# Patient Record
Sex: Male | Born: 1950 | Race: Black or African American | Hispanic: No | Smoking: Former smoker
Health system: Southern US, Community
[De-identification: ages and names within clinical notes are randomized; demographics above are authoritative.]

## PROBLEM LIST (undated history)

## (undated) DIAGNOSIS — C61 Malignant neoplasm of prostate: Secondary | ICD-10-CM

## (undated) DIAGNOSIS — I2510 Atherosclerotic heart disease of native coronary artery without angina pectoris: Secondary | ICD-10-CM

## (undated) DIAGNOSIS — I10 Essential (primary) hypertension: Secondary | ICD-10-CM

## (undated) DIAGNOSIS — E119 Type 2 diabetes mellitus without complications: Secondary | ICD-10-CM

## (undated) DIAGNOSIS — M1990 Unspecified osteoarthritis, unspecified site: Secondary | ICD-10-CM

## (undated) DIAGNOSIS — E785 Hyperlipidemia, unspecified: Secondary | ICD-10-CM

## (undated) DIAGNOSIS — I714 Abdominal aortic aneurysm, without rupture, unspecified: Secondary | ICD-10-CM

## (undated) HISTORY — PX: CARDIAC CATHETERIZATION: SHX172

## (undated) HISTORY — DX: Atherosclerotic heart disease of native coronary artery without angina pectoris: I25.10

## (undated) HISTORY — PX: PROSTATE BIOPSY: SHX241

---

## 2011-07-21 ENCOUNTER — Other Ambulatory Visit (HOSPITAL_COMMUNITY): Payer: Self-pay | Admitting: Family Medicine

## 2011-07-21 DIAGNOSIS — R200 Anesthesia of skin: Secondary | ICD-10-CM

## 2011-07-21 DIAGNOSIS — M79601 Pain in right arm: Secondary | ICD-10-CM

## 2011-07-27 DIAGNOSIS — I252 Old myocardial infarction: Secondary | ICD-10-CM

## 2011-07-27 HISTORY — DX: Old myocardial infarction: I25.2

## 2011-07-28 ENCOUNTER — Other Ambulatory Visit (HOSPITAL_COMMUNITY): Payer: Self-pay

## 2011-07-29 ENCOUNTER — Other Ambulatory Visit (HOSPITAL_COMMUNITY): Payer: Self-pay

## 2011-08-16 ENCOUNTER — Inpatient Hospital Stay (HOSPITAL_COMMUNITY)
Admission: EM | Admit: 2011-08-16 | Discharge: 2011-08-20 | DRG: 247 | Disposition: A | Discharge status: Home / Self Care | Payer: PRIVATE HEALTH INSURANCE | Attending: Cardiology | Admitting: Cardiology

## 2011-08-16 ENCOUNTER — Encounter (HOSPITAL_COMMUNITY)
Admission: EM | Disposition: A | Discharge status: Home / Self Care | Payer: Self-pay | Source: Home / Self Care | Attending: Cardiology

## 2011-08-16 ENCOUNTER — Ambulatory Visit (HOSPITAL_COMMUNITY): Admit: 2011-08-16 | Payer: Self-pay | Admitting: Cardiology

## 2011-08-16 ENCOUNTER — Emergency Department (HOSPITAL_COMMUNITY): Payer: PRIVATE HEALTH INSURANCE

## 2011-08-16 ENCOUNTER — Encounter (HOSPITAL_COMMUNITY): Payer: Self-pay | Admitting: *Deleted

## 2011-08-16 DIAGNOSIS — I472 Ventricular tachycardia, unspecified: Secondary | ICD-10-CM | POA: Diagnosis present

## 2011-08-16 DIAGNOSIS — I4729 Other ventricular tachycardia: Secondary | ICD-10-CM | POA: Diagnosis present

## 2011-08-16 DIAGNOSIS — Z794 Long term (current) use of insulin: Secondary | ICD-10-CM

## 2011-08-16 DIAGNOSIS — Z7982 Long term (current) use of aspirin: Secondary | ICD-10-CM

## 2011-08-16 DIAGNOSIS — Z955 Presence of coronary angioplasty implant and graft: Secondary | ICD-10-CM

## 2011-08-16 DIAGNOSIS — I213 ST elevation (STEMI) myocardial infarction of unspecified site: Secondary | ICD-10-CM

## 2011-08-16 DIAGNOSIS — E119 Type 2 diabetes mellitus without complications: Secondary | ICD-10-CM | POA: Diagnosis present

## 2011-08-16 DIAGNOSIS — E785 Hyperlipidemia, unspecified: Secondary | ICD-10-CM | POA: Diagnosis present

## 2011-08-16 DIAGNOSIS — I219 Acute myocardial infarction, unspecified: Secondary | ICD-10-CM

## 2011-08-16 DIAGNOSIS — Z87891 Personal history of nicotine dependence: Secondary | ICD-10-CM

## 2011-08-16 DIAGNOSIS — I2510 Atherosclerotic heart disease of native coronary artery without angina pectoris: Secondary | ICD-10-CM

## 2011-08-16 DIAGNOSIS — I2582 Chronic total occlusion of coronary artery: Secondary | ICD-10-CM | POA: Diagnosis present

## 2011-08-16 DIAGNOSIS — I2119 ST elevation (STEMI) myocardial infarction involving other coronary artery of inferior wall: Principal | ICD-10-CM | POA: Diagnosis present

## 2011-08-16 DIAGNOSIS — R7309 Other abnormal glucose: Secondary | ICD-10-CM | POA: Diagnosis present

## 2011-08-16 DIAGNOSIS — I10 Essential (primary) hypertension: Secondary | ICD-10-CM | POA: Diagnosis present

## 2011-08-16 DIAGNOSIS — Z79899 Other long term (current) drug therapy: Secondary | ICD-10-CM

## 2011-08-16 HISTORY — PX: LEFT HEART CATHETERIZATION WITH CORONARY ANGIOGRAM: SHX5451

## 2011-08-16 HISTORY — DX: Essential (primary) hypertension: I10

## 2011-08-16 HISTORY — DX: Presence of coronary angioplasty implant and graft: Z95.5

## 2011-08-16 HISTORY — DX: Hyperlipidemia, unspecified: E78.5

## 2011-08-16 LAB — COMPREHENSIVE METABOLIC PANEL
ALT: 24 U/L (ref 0–53)
Alkaline Phosphatase: 63 U/L (ref 39–117)
Alkaline Phosphatase: 60 U/L (ref 39–117)
BUN: 16 mg/dL (ref 6–23)
BUN: 16 mg/dL (ref 6–23)
CO2: 21 mEq/L (ref 19–32)
Chloride: 100 mEq/L (ref 96–112)
Chloride: 101 mEq/L (ref 96–112)
Creatinine, Ser: 1.34 mg/dL (ref 0.50–1.35)
GFR calc Af Amer: 64 mL/min — ABNORMAL LOW (ref >90)
GFR calc Af Amer: 78 mL/min — ABNORMAL LOW (ref >90)
GFR calc non Af Amer: 68 mL/min — ABNORMAL LOW (ref >90)
Glucose, Bld: 241 mg/dL — ABNORMAL HIGH (ref 70–99)
Glucose, Bld: 191 mg/dL — ABNORMAL HIGH (ref 70–99)
Potassium: 4.1 mEq/L (ref 3.5–5.1)
Potassium: 4.2 mEq/L (ref 3.5–5.1)
Sodium: 134 mEq/L — ABNORMAL LOW (ref 135–145)
Total Bilirubin: 0.3 mg/dL (ref 0.3–1.2)
Total Bilirubin: 0.3 mg/dL (ref 0.3–1.2)
Total Protein: 7.8 g/dL (ref 6.0–8.3)

## 2011-08-16 LAB — PROTIME-INR
INR: 1.06 (ref 0.00–1.49)
Prothrombin Time: 14 seconds (ref 11.6–15.2)

## 2011-08-16 LAB — CBC
HCT: 41.7 % (ref 39.0–52.0)
HCT: 39.8 % (ref 39.0–52.0)
Hemoglobin: 14 g/dL (ref 13.0–17.0)
Hemoglobin: 13.4 g/dL (ref 13.0–17.0)
MCHC: 33.6 g/dL (ref 30.0–36.0)
MCHC: 33.7 g/dL (ref 30.0–36.0)
MCV: 88.7 fL (ref 78.0–100.0)
RBC: 4.56 MIL/uL (ref 4.22–5.81)
RDW: 12.9 % (ref 11.5–15.5)

## 2011-08-16 LAB — DIFFERENTIAL
Lymphs Abs: 1.7 10*3/uL (ref 0.7–4.0)
Monocytes Absolute: 0.4 10*3/uL (ref 0.1–1.0)
Monocytes Relative: 4 % (ref 3–12)
Neutro Abs: 6.5 10*3/uL (ref 1.7–7.7)
Neutrophils Relative %: 75 % (ref 43–77)

## 2011-08-16 LAB — CARDIAC PANEL(CRET KIN+CKTOT+MB+TROPI)
Relative Index: 7.4 — ABNORMAL HIGH (ref 0.0–2.5)
Total CK: 1121 U/L — ABNORMAL HIGH (ref 7–232)
Troponin I: 15.78 ng/mL (ref <0.30)

## 2011-08-16 LAB — MRSA PCR SCREENING: MRSA by PCR: NEGATIVE

## 2011-08-16 LAB — MAGNESIUM: Magnesium: 2.1 mg/dL (ref 1.5–2.5)

## 2011-08-16 LAB — APTT: aPTT: 68 seconds — ABNORMAL HIGH (ref 24–37)

## 2011-08-16 SURGERY — LEFT HEART CATHETERIZATION WITH CORONARY ANGIOGRAM
Anesthesia: LOCAL

## 2011-08-16 MED ORDER — HEPARIN (PORCINE) IN NACL 100-0.45 UNIT/ML-% IJ SOLN
INTRAMUSCULAR | Status: AC
  Filled 2011-08-16: qty 250

## 2011-08-16 MED ORDER — HEPARIN (PORCINE) IN NACL 100-0.45 UNIT/ML-% IJ SOLN
1000.0000 [IU]/h | INTRAMUSCULAR | Status: DC
  Administered 2011-08-16: 1000 [IU]/h via INTRAVENOUS
  Filled 2011-08-16: qty 250

## 2011-08-16 MED ORDER — ASPIRIN EC 81 MG PO TBEC
81.0000 mg | DELAYED_RELEASE_TABLET | Freq: Every day | ORAL | Status: DC
  Administered 2011-08-17 – 2011-08-20 (×4): 81 mg via ORAL
  Filled 2011-08-16 (×4): qty 1

## 2011-08-16 MED ORDER — SODIUM CHLORIDE 0.9 % IJ SOLN
3.0000 mL | Freq: Two times a day (BID) | INTRAMUSCULAR | Status: DC
  Administered 2011-08-16 – 2011-08-19 (×6): 3 mL via INTRAVENOUS

## 2011-08-16 MED ORDER — SODIUM CHLORIDE 0.9 % IJ SOLN: 3.0000 mL | INTRAMUSCULAR | Status: DC | PRN

## 2011-08-16 MED ORDER — ATORVASTATIN CALCIUM 80 MG PO TABS
80.0000 mg | ORAL_TABLET | Freq: Every day | ORAL | Status: DC
  Administered 2011-08-17 – 2011-08-19 (×3): 80 mg via ORAL
  Filled 2011-08-16 (×4): qty 1

## 2011-08-16 MED ORDER — PRASUGREL HCL 10 MG PO TABS
ORAL_TABLET | ORAL | Status: AC
  Filled 2011-08-16: qty 6

## 2011-08-16 MED ORDER — SODIUM CHLORIDE 0.9 % IV SOLN: INTRAVENOUS | Status: DC

## 2011-08-16 MED ORDER — NITROGLYCERIN IN D5W 200-5 MCG/ML-% IV SOLN: 5.0000 ug/min | Freq: Once | INTRAVENOUS | Status: DC

## 2011-08-16 MED ORDER — ASPIRIN 81 MG PO CHEW: 81.0000 mg | CHEWABLE_TABLET | Freq: Every day | ORAL | Status: DC

## 2011-08-16 MED ORDER — BIVALIRUDIN 250 MG IV SOLR
INTRAVENOUS | Status: AC
  Filled 2011-08-16: qty 250

## 2011-08-16 MED ORDER — HEPARIN (PORCINE) IN NACL 2-0.9 UNIT/ML-% IJ SOLN
INTRAMUSCULAR | Status: AC
  Filled 2011-08-16: qty 2000

## 2011-08-16 MED ORDER — LIDOCAINE HCL (PF) 1 % IJ SOLN
INTRAMUSCULAR | Status: AC
  Filled 2011-08-16: qty 30

## 2011-08-16 MED ORDER — ONDANSETRON HCL 4 MG/2ML IJ SOLN: 4.0000 mg | Freq: Four times a day (QID) | INTRAMUSCULAR | Status: DC | PRN

## 2011-08-16 MED ORDER — ACTIVE PARTNERSHIP FOR HEALTH OF YOUR HEART BOOK
Freq: Once | Status: AC
  Administered 2011-08-17: 01:00:00
  Filled 2011-08-16: qty 1

## 2011-08-16 MED ORDER — SODIUM CHLORIDE 0.9 % IV SOLN
250.0000 mL | INTRAVENOUS | Status: DC | PRN
  Administered 2011-08-17: 1000 mL via INTRAVENOUS

## 2011-08-16 MED ORDER — PRASUGREL HCL 10 MG PO TABS
10.0000 mg | ORAL_TABLET | Freq: Every day | ORAL | Status: DC
  Administered 2011-08-17 – 2011-08-20 (×4): 10 mg via ORAL
  Filled 2011-08-16 (×4): qty 1

## 2011-08-16 MED ORDER — ACETAMINOPHEN 325 MG PO TABS
650.0000 mg | ORAL_TABLET | ORAL | Status: DC | PRN
  Administered 2011-08-18: 650 mg via ORAL
  Filled 2011-08-16: qty 2

## 2011-08-16 MED ORDER — MIDAZOLAM HCL 2 MG/2ML IJ SOLN
INTRAMUSCULAR | Status: AC
  Filled 2011-08-16: qty 2

## 2011-08-16 MED ORDER — SODIUM CHLORIDE 0.9 % IV SOLN: 1.0000 mL/kg/h | INTRAVENOUS | Status: AC

## 2011-08-16 MED ORDER — METOPROLOL TARTRATE 25 MG PO TABS
25.0000 mg | ORAL_TABLET | Freq: Two times a day (BID) | ORAL | Status: DC
  Administered 2011-08-16 – 2011-08-17 (×3): 25 mg via ORAL
  Filled 2011-08-16 (×5): qty 1

## 2011-08-16 MED ORDER — NITROGLYCERIN 0.2 MG/ML ON CALL CATH LAB
INTRAVENOUS | Status: AC
  Filled 2011-08-16: qty 1

## 2011-08-16 MED ORDER — ASPIRIN 81 MG PO CHEW
324.0000 mg | CHEWABLE_TABLET | Freq: Once | ORAL | Status: AC
  Administered 2011-08-16: 324 mg via ORAL
  Filled 2011-08-16: qty 4

## 2011-08-16 MED ORDER — HEPARIN SODIUM (PORCINE) 5000 UNIT/ML IJ SOLN
4000.0000 [IU] | INTRAMUSCULAR | Status: AC
  Administered 2011-08-16: 4000 [IU] via INTRAVENOUS
  Filled 2011-08-16: qty 0.8

## 2011-08-16 MED ORDER — FENTANYL CITRATE 0.05 MG/ML IJ SOLN
INTRAMUSCULAR | Status: AC
  Filled 2011-08-16: qty 2

## 2011-08-16 NOTE — CV Procedure (Signed)
Cardiac Catheterization Procedure Note  Name: Ruby Dilone MRN: [id number] DOB: 1951-01-26  Procedure: Left Heart Cath, Selective Coronary Angiography, LV angiography, PTCA and stenting of the proximal right coronary and the first obtuse marginal vessel.  Indication: 61 year old black male with history of hypertension transferred from [facility] with an acute ST elevation myocardial infarction. ECG shows ST elevation of 2 mm in leads 2, 3, aVF, in V5 and V6.  Procedural Details:  The right wrist was prepped, draped, and anesthetized with 1% lidocaine. Using the modified Seldinger technique, a 6 French sheath was introduced into the right radial artery. 3 mg of verapamil was administered through the sheath, weight-based Angiomax was administered intravenously. Standard Judkins catheters were used for selective coronary angiography and left ventriculography. Catheter exchanges were performed over an exchange length guidewire.  PROCEDURAL FINDINGS Hemodynamics: AO 114/72 with a mean of 89 mmHg LV 115/12 mmHg   Coronary angiography: Coronary dominance: right  Left mainstem: Normal.  Left anterior descending (LAD): There are moderate irregularities in the mid to distal vessel up to 20%.  Left circumflex (LCx): There is a 40% stenosis at the ostium of the left circumflex. The first obtuse marginal vessel is occluded. The second marginal branch has irregularities up to 20%. The left circumflex then continues in the AV groove to the distal posterior lateral area. There are mild irregularities in the distal vessel as well.  Right coronary artery (RCA): The right coronary is a dominant vessel. There is an 80-90% focal stenosis in the proximal vessel. In the mid vessel after the takeoff of the right ventricular marginal branch there is total occlusion. There are left-to-right collaterals to the far distal right coronary.  Left ventriculography: Left ventricular systolic function abnormal.  There is mid inferior wall hypokinesis. Overall systolic function is well preserved with an EF of 55%.  PCI Note:  Following the diagnostic procedure, the decision was made to proceed with PCI. It was unclear at this point which vessel was the culprit. Based on his EKG findings it seemed more likely that the right coronary was the culprit but there was some dye hangup in the obtuse marginal vessel. The patient received 60 mg of oral Effient. Once a therapeutic ACT was achieved, a 6 [place] FR4 guide catheter was inserted.  A  pro-water  coronary guidewire was used to cross the lesion in the proximal RCA. We were unable to cross the mid vessel lesion into the distal vessel.  The lesion in the proximal RCA  was predilated with a  2.5 mm  balloon.  The lesion was then stented with a  3.0 x 12 mm Promus element  Stent expanding to 16 atmospheres with the stent balloon.  Following PCI, there was 0% residual stenosis and TIMI-3 flow to the mid vessel.  We were never able to pass a wire into the distal RCA. In reviewing the angiograms it appeared that the distal vessel was diffusely and severely disease consistent with chronic total occlusion. We next addressed the lesion in the first obtuse marginal vessel. Using a 6 [place] XB LAD 3.5 guide were able to cross the lesion with a pro-water guidewire. We predilated the lesion with the 2.5 mm balloon. This yielded reperfusion of a large obtuse marginal vessel. There was a long area of stenosis in this vessel. This was stented with a 3.0 x 28 mm Promus element stent dilated to 12 atmospheres. This yielded an excellent angiographic result with 0% residual stenosis and TIMI grade 3 flow.  Final angiography confirmed an excellent result. The patient tolerated the procedure well. There were no immediate procedural complications. A TR band was used for radial hemostasis. The patient was transferred to the post catheterization recovery area for further monitoring.  PCI  Data: Vessel -  RCA/Segment -  proximal Percent Stenosis (pre)   80-90% TIMI-flow 3  Stent  3.0 x 12 mm Promus element  Percent Stenosis (post)  0% TIMI-flow (post)  3  Vessel #2-RCA/segment mid Percent stenosis (pre-) 100% TIMI flow 0 Unable to cross with a wire. Vessel appears to be a chronic total occlusion.  Vessel #3-obtuse marginal vessel Percent stenosis (pre-) 100% TIMI flow 0 Stent 3.0 x 28 mm Promus element Percent stenosis (post) 0% TIMI flow (post) 3  Final Conclusions:   1. 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 In retrospect, I believe the first obtuse marginal vessel was the culprit lesion. 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 2. Well-preserved LV systolic function with inferior wall hypokinesis. 3. Successful stenting of the proximal right coronary with a DES. Unable to cross the mid right coronary lesion with a wire. 4. Successful stenting of the first obtuse marginal vessel with a drug-eluting stent.   Recommendations:  Continue dual antiplatelet therapy for one year. Address blood pressure and cholesterol control.  Delara Shepheard [place] 08/16/2011, 9:52 PM

## 2011-08-16 NOTE — ED Provider Notes (Signed)
History     CSN: [id number]  Arrival date & time 08/16/11  4098   First MD Initiated Contact with Patient 08/16/11 1935      Chief Complaint  Patient presents with  . Chest Pain    (Consider location/radiation/quality/duration/timing/severity/associated sxs/prior treatment) HPI Comments: Patient complains of substernal chest pain onset at 3:30 this afternoon. It has been constant but varying in severity. He denies any shortness of breath, nausea, vomiting. He does admit some diaphoresis. There is no radiation of the pain. It is no previous cardiac history.  The history is provided by the patient.    Past Medical History  Diagnosis Date  . Hypertension     History reviewed. No pertinent past surgical history.  History reviewed. No pertinent family history.  History  Substance Use Topics  . Smoking status: Former Games developer  . Smokeless tobacco: Not on file  . Alcohol Use: Yes     little use      Review of Systems  Unable to perform ROS: Unstable vital signs  Cardiovascular: Positive for chest pain.    Allergies  Review of patient's allergies indicates no known allergies.  Home Medications  No current outpatient prescriptions on file.  BP 167/90  Temp(Src) 98.2 F (36.8 C) (Oral)  Resp 18  Ht 5\' 9"  (1.753 m)  Wt 216 lb (97.977 kg)  BMI 31.90 kg/m2  SpO2 100%  Physical Exam  Constitutional: He is oriented to person, place, and time. He appears well-developed and well-nourished. He appears distressed.  HENT:  Head: Normocephalic and atraumatic.  Mouth/Throat: Oropharynx is clear and moist. No oropharyngeal exudate.  Eyes: Conjunctivae are normal. Pupils are equal, round, and reactive to light.  Neck: Normal range of motion.  Cardiovascular: Normal rate, regular rhythm and normal heart sounds.   No murmur heard. Pulmonary/Chest: Effort normal and breath sounds normal. No respiratory distress.  Abdominal: Soft. There is no tenderness. There is no rebound and  no guarding.  Musculoskeletal: Normal range of motion. He exhibits no edema and no tenderness.  Neurological: He is alert and oriented to person, place, and time. No cranial nerve deficit.  Skin: Skin is warm.    ED Course  Procedures (including critical care time)   Labs Reviewed  APTT  CBC  COMPREHENSIVE METABOLIC PANEL  PROTIME-INR   No results found.   1. STEMI (ST elevation myocardial infarction)       MDM  Crushing substernal chest pain for the past 4 hours associated with diaphoresis. ST elevation in EKG inferior leads with reciprocal depression in the septal leads.  Code STEMI called at 1934.  D/w Dr. Swaziland who accepts patient in transfer to cath lab.  ASA, heparin bolus and gtt.   Date: 08/16/2011  Rate: 73  Rhythm: normal sinus rhythm  QRS Axis: normal  Intervals: normal  ST/T Wave abnormalities: ST elevations inferiorly, ST elevations laterally and ST depressions anteriorly  Conduction Disutrbances:none  Narrative Interpretation:   Old EKG Reviewed: none available  CRITICAL CARE Performed by: Glynn Octave   Total critical care time: 30  Critical care time was exclusive of separately billable procedures and treating other patients.  Critical care was necessary to treat or prevent imminent or life-threatening deterioration.  Critical care was time spent personally by me on the following activities: development of treatment plan with patient and/or surrogate as well as nursing, discussions with consultants, evaluation of patient's response to treatment, examination of patient, obtaining history from patient or surrogate, ordering and performing treatments and  interventions, ordering and review of laboratory studies, ordering and review of radiographic studies, pulse oximetry and re-evaluation of patient's condition.         Glynn Octave, MD 08/17/11 1220

## 2011-08-16 NOTE — ED Notes (Addendum)
Mid CP since 1530 this afternoon, denies any SOB, denies N/V, denies radiation to left arm or jaw, c/o sweating but states that he had been working outside today

## 2011-08-16 NOTE — Progress Notes (Signed)
CRITICAL VALUE ALERT  Critical value received:  Troponin 15.78 CKMB 82.9  Date of notification:  08/16/11  Time of notification:  2304  Critical value read back:yes  Nurse who received alert:  Daniele Dillow, Breck Coons  MD notified (1st page):  Mayford Knife, T   Time of first page:  2310  MD notified (2nd page):  Time of second page:  2345  Responding MD:  Kym Groom  Time MD responded:  2350

## 2011-08-16 NOTE — Progress Notes (Signed)
Patient had 2 runs NSVT- 19 beats at 2254, 10 beats 2308. Dr. Mayford Knife oncall notified, scheduled metoprolol was given. Will continue to monitor. Manuel Nguyen

## 2011-08-16 NOTE — ED Notes (Signed)
EDP [facility] to [facility].

## 2011-08-16 NOTE — ED Notes (Signed)
Pt clothing removed & place in belongings bag. Friend took belongings.

## 2011-08-16 NOTE — H&P (Addendum)
Admit date: 08/16/2011 Referring Physician [facility] Primary Cardiologist Dr. Peter Sw[place]  Chief complaint/reason for admission:  Chest pain  HPI: This is a 60yo WM with a history of HTN who presented to the ER with substernal chest pain.  The pain started today at 3:30pm with no radiation, SOB, N/V .  In ER patient was noted to have ST elevation inferiorly and in V5 and V6.  He was transferred to [facility] for emergent cardiac cath.  Currently he is in the process of cath.    PMH:    Past Medical History  Diagnosis Date   dyslipidemia   . Hypertension      PSH:   History reviewed. No pertinent past surgical history.  ALLERGIES:   Review of patient's allergies indicates no known allergies.  Prior to Admit Meds:   Carvedilol 6.25mg  BID   Family HX:   History reviewed. No pertinent family history. Social HX:    History   Social History  . Marital Status: Divorced    Spouse Name: N/A    Number of Children: N/A  . Years of Education: N/A   Occupational History  . Not on file.   Social History Main Topics  . Smoking status: Former Games developer  . Smokeless tobacco: Not on file  . Alcohol Use: Yes     little use  . Drug Use: No  . Sexually Active:    Other Topics Concern  . Not on file   Social History Narrative  . No narrative on file     ROS:  All 11 ROS were addressed and are negative except what is stated in the HPI  PHYSICAL EXAM Filed Vitals:   08/16/11 1953  BP: 158/92  Pulse: 90  Temp:   Resp: 18   General: Well developed, well nourished, in no acute distress Head: Eyes PERRLA, No xanthomas.   Normal cephalic and atramatic  Lungs:   Clear bilaterally to auscultation and percussion. Heart:   HRRR S1 S2 Pulses are 2+ & equal.            No carotid bruit. No JVD.  No abdominal bruits. No femoral bruits. Abdomen: Bowel sounds are positive, abdomen soft and non-tender without masses Extremities:   No clubbing, cyanosis or edema.  DP +1 Neuro: Alert and oriented  X 3. Psych:  Good affect, responds appropriately   Labs:   Lab Results  Component Value Date   WBC 11.2* 08/16/2011   HGB 14.0 08/16/2011   HCT 41.7 08/16/2011   MCV 88.7 08/16/2011   PLT 205 08/16/2011    Lab 08/16/11 1953  NA 135  K 4.1  CL 100  CO2 21  BUN 16  CREATININE 1.34  CALCIUM 9.8  PROT 7.8  BILITOT 0.3  ALKPHOS 63  ALT 19  AST 21  GLUCOSE 241*   No results found for this basename: CKTOTAL, CKMB, CKMBINDEX, TROPONINI   No results found for this basename: PTT   Lab Results  Component Value Date   INR 1.06 08/16/2011         Radiology:*RADIOLOGY REPORT*  Clinical Data: Code STEMI. Chest pain. Former smoker with history  of hypertension.  PORTABLE CHEST - 1 VIEW 08/16/2011 1953 hours:  Comparison: None.  Findings: Cardiac silhouette normal and mediastinal contours  unremarkable for the AP portable technique. Lungs clear.  Pulmonary vascularity normal. Bronchovascular markings normal. No  pneumothorax. No pleural effusions.  IMPRESSION:  No acute cardiopulmonary disease.  Original Report Authenticated By: Maisie Fus  E. LAWRENCE, M.D.   EKG: NSR with ST elevation 2mm inferiorly and V5 and V6  ASSESSMENT:  1.  Acute Inferior STEMI 2.  HTN   PLAN:   1.  Emergent cardiac catheterization 2.  Admit to CCU 3.  Cycle cardiac enzymes until they peak 4.  ASA/Effient 5.  Check lipids 6.  No IV NTG - apparently told nurse at [facility] that he had recently used Viagra 7.  Lipitor 80mg  daily 8.  Start Lopressor 25mg  BID 9.  Stop carvedilol  Quintella Reichert, MD  08/16/2011  8:52 PM

## 2011-08-16 NOTE — Interval H&P Note (Signed)
History and Physical Interval Note:  08/16/2011 9:46 PM  Manuel Nguyen  has presented today for surgery, with the diagnosis of r/o CAD  The various methods of treatment have been discussed with the patient and family. After consideration of risks, benefits and other options for treatment, the patient has consented to  Procedure(s) (LRB): LEFT HEART CATHETERIZATION WITH CORONARY ANGIOGRAM (N/A) as a surgical intervention .  The patients' history has been reviewed, patient examined, no change in status, stable for surgery.  I have reviewed the patients' chart and labs.  Questions were answered to the patient's satisfaction.     Theron Arista [facility]

## 2011-08-17 DIAGNOSIS — I10 Essential (primary) hypertension: Secondary | ICD-10-CM

## 2011-08-17 DIAGNOSIS — I219 Acute myocardial infarction, unspecified: Secondary | ICD-10-CM

## 2011-08-17 DIAGNOSIS — E785 Hyperlipidemia, unspecified: Secondary | ICD-10-CM | POA: Diagnosis present

## 2011-08-17 DIAGNOSIS — R7309 Other abnormal glucose: Secondary | ICD-10-CM

## 2011-08-17 DIAGNOSIS — I472 Ventricular tachycardia: Secondary | ICD-10-CM

## 2011-08-17 LAB — CARDIAC PANEL(CRET KIN+CKTOT+MB+TROPI)
Relative Index: 11.9 — ABNORMAL HIGH (ref 0.0–2.5)
Relative Index: 11.1 — ABNORMAL HIGH (ref 0.0–2.5)
Relative Index: 9 — ABNORMAL HIGH (ref 0.0–2.5)
Total CK: 2556 U/L — ABNORMAL HIGH (ref 7–232)
Total CK: 1653 U/L — ABNORMAL HIGH (ref 7–232)
Troponin I: >25 ng/mL (ref <0.30)
Troponin I: >25 ng/mL (ref <0.30)

## 2011-08-17 LAB — LIPID PANEL
Cholesterol: 300 mg/dL — ABNORMAL HIGH (ref 0–200)
HDL: 40 mg/dL (ref >39)
Total CHOL/HDL Ratio: 7.5 RATIO
Triglycerides: 171 mg/dL — ABNORMAL HIGH (ref <150)

## 2011-08-17 LAB — CBC
HCT: 38.5 % — ABNORMAL LOW (ref 39.0–52.0)
MCH: 30.4 pg (ref 26.0–34.0)
MCHC: 34.8 g/dL (ref 30.0–36.0)
MCV: 87.3 fL (ref 78.0–100.0)
RDW: 13 % (ref 11.5–15.5)
WBC: 9.8 10*3/uL (ref 4.0–10.5)

## 2011-08-17 LAB — BASIC METABOLIC PANEL
CO2: 21 mEq/L (ref 19–32)
Chloride: 101 mEq/L (ref 96–112)
GFR calc non Af Amer: 87 mL/min — ABNORMAL LOW (ref >90)
Glucose, Bld: 193 mg/dL — ABNORMAL HIGH (ref 70–99)

## 2011-08-17 LAB — POCT ACTIVATED CLOTTING TIME: Activated Clotting Time: 397 seconds

## 2011-08-17 MED ORDER — AMIODARONE HCL IN DEXTROSE 360-4.14 MG/200ML-% IV SOLN
60.0000 mg/h | INTRAVENOUS | Status: AC
  Administered 2011-08-17 (×2): 60 mg/h via INTRAVENOUS
  Filled 2011-08-17 (×2): qty 200

## 2011-08-17 MED ORDER — AMIODARONE HCL IN DEXTROSE 360-4.14 MG/200ML-% IV SOLN
30.0000 mg/h | INTRAVENOUS | Status: DC
  Administered 2011-08-17 (×3): 30 mg/h via INTRAVENOUS
  Filled 2011-08-17 (×5): qty 200

## 2011-08-17 MED ORDER — AMIODARONE LOAD VIA INFUSION
150.0000 mg | Freq: Once | INTRAVENOUS | Status: AC
  Administered 2011-08-17: 150 mg via INTRAVENOUS
  Filled 2011-08-17: qty 83.34

## 2011-08-17 MED ORDER — HEPARIN SODIUM (PORCINE) 5000 UNIT/ML IJ SOLN
5000.0000 [IU] | Freq: Three times a day (TID) | INTRAMUSCULAR | Status: DC
  Administered 2011-08-17 – 2011-08-20 (×9): 5000 [IU] via SUBCUTANEOUS
  Filled 2011-08-17 (×12): qty 1

## 2011-08-17 MED ORDER — LISINOPRIL 2.5 MG PO TABS
2.5000 mg | ORAL_TABLET | Freq: Two times a day (BID) | ORAL | Status: DC
  Administered 2011-08-17 – 2011-08-20 (×7): 2.5 mg via ORAL
  Filled 2011-08-17 (×8): qty 1

## 2011-08-17 MED FILL — Dextrose Inj 5%: INTRAVENOUS | Qty: 50 | Status: AC

## 2011-08-17 NOTE — Progress Notes (Signed)
*  PRELIMINARY RESULTS* Echocardiogram 2D Echocardiogram has been performed.  Manuel Nguyen [facility] 08/17/2011, 4:13 PM

## 2011-08-17 NOTE — Progress Notes (Signed)
Called Dr. Mayford Knife oncall to inform of more runs of NSVT. Patient has now had a total of 8 runs since admission to CCU with the longest being 19 beats, also noting 6 runs of 3 PVC's in a row. Order for amiodarone per protocol received. Will continue to monitor. Manuel Nguyen

## 2011-08-17 NOTE — Care Management Note (Addendum)
    Page 1 of 1   08/19/2011     9:28:35 AM   CARE MANAGEMENT NOTE 08/19/2011  Patient:  Manuel Nguyen, Manuel Nguyen   Account Number:  [id number]  Date Initiated:  08/17/2011  Documentation initiated by:  Junius Creamer  Subjective/Objective Assessment:   adm w mi     Action/Plan:   lives w sister, no ins, he does have pcp dr Jorene Guest in [place], he gets meds from [facility] in [place].has income from soc security.   Anticipated DC Date:  08/19/2011   Anticipated DC Plan:  HOME/SELF CARE      DC Planning Services  CM consult      Choice offered to / List presented to:             Status of service:  Completed, signed off Medicare Important Message given?   (If response is "NO", the following Medicare IM given date fields will be blank) Date Medicare IM given:   Date Additional Medicare IM given:    Discharge Disposition:  HOME/SELF CARE  Per UR Regulation:  Reviewed for med. necessity/level of care/duration of stay  If discussed at Long Length of Stay Meetings, dates discussed:    Comments:  08-19-11 0923 Tomi Bamberger, RN,BSN [phone] CM did speak to pt and gave him a 30 day free card. He stated he did not receive one on 2900. CM did call [facility] in [place] and [place] is available.  Pt is aware. Per pt plan for d/c in am.  08/17/11 9:50a debbie dowell rn,bsn [phone] gave pt effient card for 30days free. have filled out effient pt assist form and faxed in but pt will need to mail in proof of income before he can be approved or not. he has form and effient card.

## 2011-08-17 NOTE — Progress Notes (Signed)
[facility] Resident Note  Subjective:  Patient had NSVT with the longest of 10 beats and PVC's . Patient was given scheduled Metoprolol  With no improvement and then was started on  amiodarone drip currently at a rate of 60 mg/hr. Since then no more NSVT. Patient feels good. Denies any chest pain or SOB. Would like to go home as soon as possible.   Objective:  Vital Signs in the last 24 hours: Filed Vitals:   08/17/11 0300 08/17/11 0400 08/17/11 0500 08/17/11 0600  BP: 133/77 134/77 139/75 135/78  Pulse: 74 66 66 67  Temp:  97.9 F (36.6 C)    TempSrc:  Oral    Resp: 17 17 17 17   Height:      Weight:      SpO2: 99% 99% 99% 98%    Intake/Output from previous day: 05/21 0701 - 05/22 0700 In: 1191.1 [P.O.:220; I.V.:971.1] Out: 475 [Urine:475]  24-hour weight change: Weight change:   Weight trends: Filed Weights   08/16/11 1937 08/16/11 2200  Weight: 216 lb (97.977 kg) 215 lb 2.7 oz (97.6 kg)    Physical Exam: Pt is alert and oriented, NAD HEENT: normal Neck: JVP - normal, carotids 2+ equal without bruits Lungs: CTA bilaterally CV: RRR without murmur or gallop Abd: soft, NT, Positive BS, no hepatomegaly Ext: no C/C/E, distal pulses intact and equal Skin: warm/dry no rash  Lab Results:  Basename 08/17/11 0428 08/16/11 2218  WBC 9.8 8.7  HGB 13.4 13.4  PLT 226 224    Basename 08/17/11 0428 08/16/11 2218  NA 133* 134*  K 3.9 4.2  CL 101 101  CO2 21 21  GLUCOSE 193* 191*  BUN 14 16  CREATININE 0.97 1.14    Basename 08/17/11 0405 08/16/11 2218  TROPONINI >25.00* 15.78*   Cardiac Studies Cardiac cath 5/21:  AO 114/72 with a mean of 89 mmHg  LV 115/12 mmHg  Coronary angiography:  Coronary dominance: right  Left mainstem: Normal.  Left anterior descending (LAD): There are moderate irregularities in the mid to distal vessel up to 20%.  Left circumflex (LCx): There is a 40% stenosis at the ostium of the left circumflex. The first obtuse  marginal vessel is occluded. The second marginal branch has irregularities up to 20%. The left circumflex then continues in the AV groove to the distal posterior lateral area. There are mild irregularities in the distal vessel as well.  Right coronary artery (RCA): The right coronary is a dominant vessel. There is an 80-90% focal stenosis in the proximal vessel. In the mid vessel after the takeoff of the right ventricular marginal branch there is total occlusion. There are left-to-right collaterals to the far distal right coronary.  Left ventriculography: Left ventricular systolic function abnormal. There is mid inferior wall hypokinesis. Overall systolic function is well preserved with an EF of 55%.  1. 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 In retrospect, I believe the first obtuse marginal vessel was the culprit lesion. 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  2. Well-preserved LV systolic function with inferior wall hypokinesis.  3. Successful stenting of the proximal right coronary with a DES. Unable to cross the mid right coronary lesion with a wire.  4. Successful stenting of the first obtuse marginal vessel with a drug-eluting stent   Scheduled Meds:   . active partnership for health of your heart book   Does not  apply Once  . amiodarone  150 mg Intravenous Once  . aspirin  324 mg Oral Once  . aspirin EC  81 mg Oral Daily  . atorvastatin  80 mg Oral q1800  . bivalirudin      . fentaNYL      . heparin      . heparin      . heparin  4,000 Units Intravenous STAT  . lidocaine      . metoprolol tartrate  25 mg Oral BID  . midazolam      . nitroGLYCERIN      . prasugrel      . prasugrel  10 mg Oral Daily  . sodium chloride  3 mL Intravenous Q12H  . DISCONTD: aspirin  81 mg Oral Daily  . DISCONTD: nitroGLYCERIN  5-200 mcg/min Intravenous Once   Continuous Infusions:   . sodium  chloride Stopped (08/17/11 0600)  . amiodarone (NEXTERONE PREMIX) 360 mg/200 mL dextrose 60 mg/hr (08/17/11 0549)   And  . amiodarone (NEXTERONE PREMIX) 360 mg/200 mL dextrose    . DISCONTD: sodium chloride    . DISCONTD: heparin Stopped (08/16/11 2032)   PRN Meds:.sodium chloride, acetaminophen, ondansetron (ZOFRAN) IV, sodium chloride  Imaging: Dg Chest Port 1 View  08/16/2011  *RADIOLOGY REPORT*  Clinical Data: Code STEMI.  Chest pain.  Former smoker with history of hypertension.  PORTABLE CHEST - 1 VIEW  08/16/2011 1953 hours:  Comparison: None.  Findings: Cardiac silhouette normal and mediastinal contours unremarkable for the AP portable technique.  Lungs clear. Pulmonary vascularity normal.  Bronchovascular markings normal.  No pneumothorax.  No pleural effusions.  IMPRESSION: No acute cardiopulmonary disease.  Original Report Authenticated By: Arnell Sieving, M.D.   Assessment/Plan:   Pt is a 61 y.o. yo male with a PMHx of Hypertension and HLD who was admitted on 08/16/2011 for STEMI.   1. 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 s/p PCI of proximal right coronary and  first obtuse marginal vessel with a DES. Currently on Aspirin, Lipitor,  Metoprolol and Effient . -Consider to start ACEi  - TSH, Hgb A1c pending   2. Hypertension: Currently on Metoprolol. Home medication was Verapamil.  - Consider to start ACEi   3. NSVT - resolved after starting on amiodarone drip.  - Will continue for another 24 hours   4. DVTppx: Heparin   Length of Stay: 1 days  Patient history and plan of care reviewed with attending, Dr. Swaziland.  Almyra Deforest, M.D. 08/17/2011, 7:04 AM  Patient seen and examined and history reviewed. Agree with above findings and plan. Feeling much better today. Slight chest discomfort. Ecg has normalized. Significant enzyme bump. He has had multiple runs of NSVT on monitor, now on amio. Will need to monitor in unit today. Proceed with  cardiac rehab. Will start ACEi as well as beta blocker for BP control. Carbohydrate modified diet. Check lipids and A1c. Check Echo today. Explained importance of compliance with meds and follow up.  Theron Arista [facility] 08/17/2011 8:57 AM

## 2011-08-17 NOTE — Progress Notes (Signed)
Inpatient Diabetes Program Recommendations  AACE/ADA: New Consensus Statement on Inpatient Glycemic Control (2009)  Target Ranges:  Prepandial:   less than 140 mg/dL      Peak postprandial:   less than 180 mg/dL (1-2 hours)      Critically ill patients:  140 - 180 mg/dL  Results for DELOY, ARCHEY (MRN [id number]) as of 08/17/2011 09:59  Ref. Range 08/16/2011 19:53 08/16/2011 22:18 08/17/2011 04:05 08/17/2011 04:28  Glucose Latest Range: 70-99 mg/dL 829 (H) 562 (H)  130 (H)    Inpatient Diabetes Program Recommendations Correction (SSI): Start SENSITIVE scale TID   Note: A1C result pending.    Thank you  Piedad [facility] Inpatient Diabetes Coordinator [phone]

## 2011-08-17 NOTE — Progress Notes (Signed)
CARDIAC REHAB PHASE I   PRE:  Rate/Rhythm: 61 SR    BP: sitting 151/91    SaO2:   MODE:  Ambulation: 280 ft   POST:  Rate/Rhythm: 91    BP: sitting 144/76     SaO2:   Tolerated well. Anxious to be walking. Denied problems. Return to recliner. BP actually lower after walk. Began ed. [id number]  [name] CES, ACSM

## 2011-08-18 LAB — BASIC METABOLIC PANEL
CO2: 22 mEq/L (ref 19–32)
Chloride: 102 mEq/L (ref 96–112)
Creatinine, Ser: 1.12 mg/dL (ref 0.50–1.35)
GFR calc Af Amer: 80 mL/min — ABNORMAL LOW (ref >90)
Potassium: 3.6 mEq/L (ref 3.5–5.1)
Sodium: 135 mEq/L (ref 135–145)

## 2011-08-18 MED ORDER — METOPROLOL TARTRATE 50 MG PO TABS
50.0000 mg | ORAL_TABLET | Freq: Two times a day (BID) | ORAL | Status: DC
  Administered 2011-08-18 – 2011-08-20 (×5): 50 mg via ORAL
  Filled 2011-08-18 (×6): qty 1

## 2011-08-18 NOTE — Progress Notes (Signed)
[facility] Resident Note  Subjective:  Last episode of NSVT at 1300 yesterday. NSR since.  Patient feels good. Denies any chest pain or SOB. Ambulating in halls.  Objective:  Vital Signs in the last 24 hours: Filed Vitals:   08/18/11 0100 08/18/11 0300 08/18/11 0400 08/18/11 0500  BP: 127/67 128/65  123/67  Pulse:      Temp:   99.6 F (37.6 C)   TempSrc:   Oral   Resp: 19 23 19 19   Height:      Weight:      SpO2:   94%     Intake/Output from previous day: 05/22 0701 - 05/23 0700 In: 1514.1 [P.O.:600; I.V.:914.1] Out: 1300 [Urine:1300]  24-hour weight change: Weight change:   Weight trends: Filed Weights   08/16/11 1937 08/16/11 2200  Weight: 97.977 kg (216 lb) 97.6 kg (215 lb 2.7 oz)    Physical Exam: Pt is alert and oriented, NAD HEENT: normal Neck: JVP - normal, carotids 2+ equal without bruits Lungs: CTA bilaterally CV: RRR without murmur or gallop Abd: soft, NT, Positive BS, no hepatomegaly Ext: no C/C/E, distal pulses intact and equal Skin: warm/dry no rash  Lab Results:  Basename 08/17/11 0428 08/16/11 2218  WBC 9.8 8.7  HGB 13.4 13.4  PLT 226 224    Basename 08/18/11 0612 08/17/11 0428  NA 135 133*  K 3.6 3.9  CL 102 101  CO2 22 21  GLUCOSE 128* 193*  BUN 9 14  CREATININE 1.12 0.97    Basename 08/17/11 1739 08/17/11 0954  TROPONINI >25.00* >25.00*   Cardiac Studies Cardiac cath 5/21:  AO 114/72 with a mean of 89 mmHg  LV 115/12 mmHg  Coronary angiography:  Coronary dominance: right  Left mainstem: Normal.  Left anterior descending (LAD): There are moderate irregularities in the mid to distal vessel up to 20%.  Left circumflex (LCx): There is a 40% stenosis at the ostium of the left circumflex. The first obtuse marginal vessel is occluded. The second marginal branch has irregularities up to 20%. The left circumflex then continues in the AV groove to the distal posterior lateral area. There are mild irregularities in  the distal vessel as well.  Right coronary artery (RCA): The right coronary is a dominant vessel. There is an 80-90% focal stenosis in the proximal vessel. In the mid vessel after the takeoff of the right ventricular marginal branch there is total occlusion. There are left-to-right collaterals to the far distal right coronary.  Left ventriculography: Left ventricular systolic function abnormal. There is mid inferior wall hypokinesis. Overall systolic function is well preserved with an EF of 55%.  1. 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 In retrospect, I believe the first obtuse marginal vessel was the culprit lesion. 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  2. Well-preserved LV systolic function with inferior wall hypokinesis.  3. Successful stenting of the proximal right coronary with a DES. Unable to cross the mid right coronary lesion with a wire.  4. Successful stenting of the first obtuse marginal vessel with a drug-eluting stent   Scheduled Meds:    . aspirin EC  81 mg Oral Daily  . atorvastatin  80 mg Oral q1800  . heparin subcutaneous  5,000 Units Subcutaneous Q8H  . lisinopril  2.5 mg Oral BID  . metoprolol tartrate  50 mg Oral BID  . prasugrel  10 mg Oral Daily  .  sodium chloride  3 mL Intravenous Q12H  . DISCONTD: metoprolol tartrate  25 mg Oral BID   Continuous Infusions:    . DISCONTD: amiodarone (NEXTERONE PREMIX) 360 mg/200 mL dextrose 30 mg/hr (08/17/11 2329)   PRN Meds:.sodium chloride, acetaminophen, ondansetron (ZOFRAN) IV, sodium chloride  Imaging: Dg Chest Port 1 View  08/16/2011  *RADIOLOGY REPORT*  Clinical Data: Code STEMI.  Chest pain.  Former smoker with history of hypertension.  PORTABLE CHEST - 1 VIEW  08/16/2011 1953 hours:  Comparison: None.  Findings: Cardiac silhouette normal and mediastinal contours unremarkable for the AP portable technique.  Lungs  clear. Pulmonary vascularity normal.  Bronchovascular markings normal.  No pneumothorax.  No pleural effusions.  IMPRESSION: No acute cardiopulmonary disease.  Original Report Authenticated By: Arnell Sieving, M.D.   *[facility]* *[facility]* [address] [place], [place] [address] [phone]  ------------------------------------------------------------ Transthoracic Echocardiography  Patient: Manuel Nguyen, Manuel Nguyen MR #: [id number] Study Date: 08/17/2011 Gender: M Age: 61 Height: 185.4cm Weight: 97.6kg BSA: 2.72m^2 Pt. Status: Room: 2907  ADMITTING Jamilet Ambroise [place], MD ATTENDING Chera Slivka [place], MD [facility] Lamees Gable [place], MD REFERRING Burleigh Brockmann [place], MD PERFORMING [place], [facility] SONOGRAPHER Nolon Rod, RDCS cc:  ------------------------------------------------------------ LV EF: 55% - 60%  ------------------------------------------------------------ Indications: MI - acute 410.91.  ------------------------------------------------------------ History: Risk factors: Hypertension.  ------------------------------------------------------------ Study Conclusions  - Left ventricle: Systolic function was normal. The estimated ejection fraction was in the range of 55% to 60%. Basal posterior and basal anterolateral hypokinesis. Features are consistent with a pseudonormal left ventricular filling pattern, with concomitant abnormal relaxation and increased filling pressure (grade 2 diastolic dysfunction). - Aortic valve: There was no stenosis. - Mitral valve: Mildly calcified annulus. Trivial regurgitation. - Left atrium: The atrium was mildly dilated. - Right ventricle: The cavity size was normal. Systolic function was normal. - Pulmonary arteries: No complete TR doppler jet so unable to estimate PA systolic pressure. - Systemic veins: IVC measured 2.0 cm with normal respirophasic variation, suggesting RA pressure 6-10 mmHg. Impressions:  -  Normal LV size and systolic function, EF 55-60%. Basal posterior and basal anterolateral hypokinesis. Moderate diastolic dysfunction. Normal RV size and systolic function. No significant valvular dysfunction.   Assessment/Plan:   Pt is a 61 y.o. yo male with a PMHx of Hypertension and HLD who was admitted on 08/16/2011 for STEMI.   1. 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 s/p PCI of proximal right coronary and  first obtuse marginal vessel with a DES. Currently on Aspirin, Lipitor,  Metoprolol and Effient . 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  -continue ACEi  - increase beta blocker.  2. Hypertension: Currently on Metoprolol. Home medication was Verapamil.  - on start ACEi   3. NSVT - probably reperfusion related within first 24-48 hrs. Normal EF. Will DC amiodarone and observe. Increase metoprolol  4. DVTppx: Heparin   5. Diabetes mellitus A1c 7.2% consider metformin at discharge.  6. Hyperlipidemia severe. On high dose Lipitor.  Length of Stay: 2 days   Mclean Moya [place], M.D.[facility] 08/18/2011, 8:45 AM

## 2011-08-18 NOTE — Progress Notes (Signed)
CARDIAC REHAB PHASE I   PRE:  Rate/Rhythm: 98 SR    BP: sitting 145/76    SaO2:   MODE:  Ambulation: 700 ft   POST:  Rate/Rhythm: 116 ST    BP: sitting 162/83     SaO2:   Tolerated well. HR and BP elevated more walking. Ed completed. Discussed A1C and lipids. Sts he checks CBG every other morning and its 80-90. Encouraged to check at another time of the day and follow up with PCP. Requests his [facility] be sent to [facility]. Gave financial aid application. [phone]  [name] CES, ACSM

## 2011-08-19 NOTE — Progress Notes (Signed)
Subjective:  Patient feels good. Denies any chest pain or SOB. Ambulating in halls. No chest pain or SOB  Objective:  Vital Signs in the last 24 hours: Filed Vitals:   08/18/11 1600 08/18/11 1839 08/18/11 2100 08/19/11 0500  BP: 130/74 136/73 133/73 116/71  Pulse:  94 91 85  Temp: 98.6 F (37 C) 100.4 F (38 C) 98.7 F (37.1 C) 98.8 F (37.1 C)  TempSrc: Oral Oral Oral Oral  Resp:  19 20 20   Height:      Weight:      SpO2: 98% 97% 96% 97%    Intake/Output from previous day: 05/23 0701 - 05/24 0700 In: 876.7 [P.O.:840; I.V.:36.7] Out: -   24-hour weight change: Weight change:   Weight trends: Filed Weights   08/16/11 1937 08/16/11 2200  Weight: 97.977 kg (216 lb) 97.6 kg (215 lb 2.7 oz)   Telemetry: NSR, single PVC triplet.  Physical Exam: Pt is alert and oriented, NAD HEENT: normal Neck: JVP - normal, carotids 2+ equal without bruits Lungs: CTA bilaterally CV: RRR without murmur or gallop Abd: soft, NT, Positive BS, no hepatomegaly Ext: no C/C/E, distal pulses intact and equal Skin: warm/dry no rash  Lab Results:  Basename 08/17/11 0428 08/16/11 2218  WBC 9.8 8.7  HGB 13.4 13.4  PLT 226 224    Basename 08/18/11 0612 08/17/11 0428  NA 135 133*  K 3.6 3.9  CL 102 101  CO2 22 21  GLUCOSE 128* 193*  BUN 9 14  CREATININE 1.12 0.97    Basename 08/17/11 1739 08/17/11 0954  TROPONINI >25.00* >25.00*   Cardiac Studies Cardiac cath 5/21:  AO 114/72 with a mean of 89 mmHg  LV 115/12 mmHg  Coronary angiography:  Coronary dominance: right  Left mainstem: Normal.  Left anterior descending (LAD): There are moderate irregularities in the mid to distal vessel up to 20%.  Left circumflex (LCx): There is a 40% stenosis at the ostium of the left circumflex. The first obtuse marginal vessel is occluded. The second marginal branch has irregularities up to 20%. The left circumflex then continues in the AV groove to the distal posterior lateral area. There are  mild irregularities in the distal vessel as well.  Right coronary artery (RCA): The right coronary is a dominant vessel. There is an 80-90% focal stenosis in the proximal vessel. In the mid vessel after the takeoff of the right ventricular marginal branch there is total occlusion. There are left-to-right collaterals to the far distal right coronary.  Left ventriculography: Left ventricular systolic function abnormal. There is mid inferior wall hypokinesis. Overall systolic function is well preserved with an EF of 55%.  1. 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 In retrospect, I believe the first obtuse marginal vessel was the culprit lesion. 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  2. Well-preserved LV systolic function with inferior wall hypokinesis.  3. Successful stenting of the proximal right coronary with a DES. Unable to cross the mid right coronary lesion with a wire.  4. Successful stenting of the first obtuse marginal vessel with a drug-eluting stent   Scheduled Meds:    . aspirin EC  81 mg Oral Daily  . atorvastatin  80 mg Oral q1800  . heparin subcutaneous  5,000 Units Subcutaneous Q8H  . lisinopril  2.5 mg Oral BID  . metoprolol tartrate  50 mg Oral BID  . prasugrel  10  mg Oral Daily  . sodium chloride  3 mL Intravenous Q12H   Continuous Infusions:   PRN Meds:.acetaminophen, ondansetron (ZOFRAN) IV, sodium chloride, DISCONTD: sodium chloride  Imaging: No results found.  Transthoracic Echocardiography  Patient: Manuel Nguyen, Manuel Nguyen MR #: [id number] Study Date: 08/17/2011 Gender: M Age: 61 Height: 185.4cm Weight: 97.6kg BSA: 2.3m^2 Pt. Status: Room: 2907  ADMITTING Kyrstan Gotwalt [place], MD ATTENDING Hiram Mciver [place], MD [facility] Sonnet Rizor [place], MD REFERRING Khylon Davies [place], MD PERFORMING [place], [facility] SONOGRAPHER Nolon Rod,  RDCS cc:  ------------------------------------------------------------ LV EF: 55% - 60%  ------------------------------------------------------------ Indications: MI - acute 410.91.  ------------------------------------------------------------ History: Risk factors: Hypertension.  ------------------------------------------------------------ Study Conclusions  - Left ventricle: Systolic function was normal. The estimated ejection fraction was in the range of 55% to 60%. Basal posterior and basal anterolateral hypokinesis. Features are consistent with a pseudonormal left ventricular filling pattern, with concomitant abnormal relaxation and increased filling pressure (grade 2 diastolic dysfunction). - Aortic valve: There was no stenosis. - Mitral valve: Mildly calcified annulus. Trivial regurgitation. - Left atrium: The atrium was mildly dilated. - Right ventricle: The cavity size was normal. Systolic function was normal. - Pulmonary arteries: No complete TR doppler jet so unable to estimate PA systolic pressure. - Systemic veins: IVC measured 2.0 cm with normal respirophasic variation, suggesting RA pressure 6-10 mmHg. Impressions:  - Normal LV size and systolic function, EF 55-60%. Basal posterior and basal anterolateral hypokinesis. Moderate diastolic dysfunction. Normal RV size and systolic function. No significant valvular dysfunction.   Assessment/Plan:   Pt is a 61 y.o. yo male with a PMHx of Hypertension and HLD who was admitted on 08/16/2011 for STEMI.   1. 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 s/p PCI of proximal right coronary and  first obtuse marginal vessel with a DES.  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 On ASA, Effient, metoprolol, and lisinopril. Given NSVT I would recommend 1 more day in hospital. Plan discharge in am if stable.  2. Hypertension: BP well controlled on meds.  3. NSVT - probably reperfusion  related within first 24-48 hrs. Normal EF. Amiodarone DC yesterday. On metoprolol.   4. DVTppx: Heparin   5. Diabetes mellitus A1c 7.2%, glucose down to 128 today. Consider metformin at discharge.  6. Hyperlipidemia severe. On high dose Lipitor.  Length of Stay: 3 days   Keelan Tripodi [place], M.D.[facility] 08/19/2011, 8:49 AM

## 2011-08-19 NOTE — Progress Notes (Signed)
CARDIAC REHAB PHASE I   PRE:  Rate/Rhythm: 81 SR  BP:  Supine:   Sitting: 122/73  Standing:    SaO2: 96 RA  MODE:  Ambulation: 1520 ft   POST:  Rate/Rhythem: 92 SR  BP:  Supine:   Sitting: 139/90  Standing:    SaO2: 99 RA 1345-1510 Tolerated ambulation well without c/o of cp or SOB. VS stable. Reviewed discharge education with pt. Stressed importance of taking  Effient, ASA and proper use of sl NTG.  Beatrix Fetters

## 2011-08-20 DIAGNOSIS — E119 Type 2 diabetes mellitus without complications: Secondary | ICD-10-CM | POA: Diagnosis present

## 2011-08-20 MED ORDER — ASPIRIN 81 MG PO TBEC: 81.0000 mg | DELAYED_RELEASE_TABLET | Freq: Every day | ORAL | Status: AC

## 2011-08-20 MED ORDER — PRASUGREL HCL 10 MG PO TABS: 10.0000 mg | ORAL_TABLET | Freq: Every day | ORAL | Status: DC

## 2011-08-20 MED ORDER — LISINOPRIL 2.5 MG PO TABS: 2.5000 mg | ORAL_TABLET | Freq: Two times a day (BID) | ORAL | Status: DC

## 2011-08-20 MED ORDER — ATORVASTATIN CALCIUM 80 MG PO TABS: 80.0000 mg | ORAL_TABLET | Freq: Every day | ORAL | Status: DC

## 2011-08-20 MED ORDER — METOPROLOL TARTRATE 50 MG PO TABS: 50.0000 mg | ORAL_TABLET | Freq: Two times a day (BID) | ORAL | Status: DC

## 2011-08-20 NOTE — Discharge Instructions (Signed)
PLEASE REMEMBER TO BRING ALL OF YOUR MEDICATIONS TO EACH OF YOUR FOLLOW-UP OFFICE VISITS.  PLEASE ATTEND ALL SCHEDULED FOLLOW-UP APPOINTMENTS.   Activity: Increase activity slowly as tolerated. You may shower, but no soaking baths (or swimming) for 1 week. No driving for 2 days. No lifting over 5 lbs for 1 week. No sexual activity for 1 week.   You May Return to Work: in 1 week (if applicable)  Wound Care: You may wash cath site gently with soap and water. Keep cath site clean and dry. If you notice pain, swelling, bleeding or pus at your cath site, please call [phone].    Groin Site Care Refer to this sheet in the next few weeks. These instructions provide you with information on caring for yourself after your procedure. Your caregiver may also give you more specific instructions. Your treatment has been planned according to current medical practices, but problems sometimes occur. Call your caregiver if you have any problems or questions after your procedure. HOME CARE INSTRUCTIONS  You may shower 24 hours after the procedure. Remove the bandage (dressing) and gently wash the site with plain soap and water. Gently pat the site dry.   Do not apply powder or lotion to the site.   Do not sit in a bathtub, swimming pool, or whirlpool for 5 to 7 days.   No bending, squatting, or lifting anything over 10 pounds (4.5 kg) as directed by your caregiver.   Inspect the site at least twice daily.   Do not drive home if you are discharged the same day of the procedure. Have someone else drive you.   You may drive 24 hours after the procedure unless otherwise instructed by your caregiver.  What to expect:  Any bruising will usually fade within 1 to 2 weeks.   Blood that collects in the tissue (hematoma) may be painful to the touch. It should usually decrease in size and tenderness within 1 to 2 weeks.  SEEK IMMEDIATE MEDICAL CARE IF:  You have unusual pain at the groin site or down the  affected leg.   You have redness, warmth, swelling, or pain at the groin site.   You have drainage (other than a small amount of blood on the dressing).   You have chills.   You have a fever or persistent symptoms for more than 72 hours.   You have a fever and your symptoms suddenly get worse.   Your leg becomes pale, cool, tingly, or numb.   You have heavy bleeding from the site. Hold pressure on the site.  Document Released: 04/16/2010 Document Revised: 03/03/2011 Document Reviewed: 04/16/2010 Exit[employer] Patient Information 2012 [employer].  

## 2011-08-20 NOTE — Progress Notes (Signed)
 CARDIOLOGY DISCHARGE SUMMARY   Patient ID: Manuel Nguyen MRN: [id number] DOB/AGE: 04/06/1950 61 y.o.  Admit date: 08/16/2011 Discharge date: 08/20/2011  Primary Discharge Diagnosis:   *STEMI (ST elevation myocardial infarction)  Secondary Discharge Diagnosis:  HTN (hypertension)  Hyperlipidemia  NSVT (nonsustained ventricular tachycardia)  Diabetes - Blood glucose abnormal  Consults: Cardiac rehabilitation, diabetes coordinator  Procedures: Left Heart Cath, Selective Coronary Angiography, LV angiography, PTCA and stenting of the proximal right coronary and the first obtuse marginal vessel.  Hospital Course: Manuel Nguyen is a 61-year-old male with no previous history of coronary artery disease. He went to the [place] emergency room with chest pain. He had ST elevation inferiorly as well as V5 and V6. He was emergently transferred to [place] and taken to the Cath Lab.  The cardiac catheterization results and stenting results are described below. He tolerated the procedure well. His EF was preserved at catheterization and a follow-up echocardiogram confirmed this. His lipid profile and hemoglobin A1c are below. He was started on a statin and is to go on a diabetic diet. He is to followup with his primary care physician for this. He was started on a beta blocker as well. He was seen by cardiac rehabilitation. He has a remote history of tobacco use and continued cessation was encouraged. He he continued to improve.  On 08/20/2011, he was seen by Dr. Ross. He was ambulating without chest pain or shortness of breath. His medication profile was reviewed. No further changes were indicated. He is considered stable for discharge, to follow up as an outpatient (in [place] at his request).  Labs:   Lab Results  Component Value Date   WBC 9.8 08/17/2011   HGB 13.4 08/17/2011   HCT 38.5* 08/17/2011   MCV 87.3 08/17/2011   PLT 226 08/17/2011    Lab 08/18/11 0612 08/16/11 2218  NA 135 --  K  3.6 --  CL 102 --  CO2 22 --  BUN 9 --  CREATININE 1.12 --  CALCIUM 8.6 --  PROT -- 7.4  BILITOT -- 0.3  ALKPHOS -- 60  ALT -- 24  AST -- 63*  GLUCOSE 128* --    Basename 08/17/11 1739  CKTOTAL 1653*  CKMB 148.9*  CKMBINDEX --  TROPONINI >25.00*   Lipid Panel     Component Value Date/Time   CHOL 300* 08/17/2011 0954   TRIG 171* 08/17/2011 0954   HDL 40 08/17/2011 0954   CHOLHDL 7.5 08/17/2011 0954   VLDL 34 08/17/2011 0954   LDLCALC 226* 08/17/2011 0954   Lab Results  Component Value Date   HGBA1C 7.2* 08/16/2011      Radiology: Dg Chest Port 1 View 08/16/2011  *RADIOLOGY REPORT*  Clinical Data: Code STEMI.  Chest pain.  Former smoker with history of hypertension.  PORTABLE CHEST - 1 VIEW  08/16/2011 1953 hours:  Comparison: None.  Findings: Cardiac silhouette normal and mediastinal contours unremarkable for the AP portable technique.  Lungs clear. Pulmonary vascularity normal.  Bronchovascular markings normal.  No pneumothorax.  No pleural effusions.  IMPRESSION: No acute cardiopulmonary disease.  Original Report Authenticated By: THOMAS E. LAWRENCE, M.D.    Cardiac Cath: 08/16/2011 Left mainstem: Normal.  Left anterior descending (LAD): There are moderate irregularities in the mid to distal vessel up to 20%.  Left circumflex (LCx): There is a 40% stenosis at the ostium of the left circumflex. The first obtuse marginal vessel is occluded. The second marginal branch has irregularities up to 20%. The left   circumflex then continues in the AV groove to the distal posterior lateral area. There are mild irregularities in the distal vessel as well.  Right coronary artery (RCA): The right coronary is a dominant vessel. There is an 80-90% focal stenosis in the proximal vessel. In the mid vessel after the takeoff of the right ventricular marginal branch there is total occlusion. There are left-to-right collaterals to the far distal right coronary.  Left ventriculography: Left ventricular  systolic function abnormal. There is mid inferior wall hypokinesis. Overall systolic function is well preserved with an EF of 55%. PCI Data:  Vessel - RCA/Segment - proximal  Percent Stenosis (pre) 80-90%  TIMI-flow 3  Stent 3.0 x 12 mm Promus element  Percent Stenosis (post) 0%  TIMI-flow (post) 3  Vessel #2-RCA/segment mid  Percent stenosis (pre-) 100%  TIMI flow 0  Unable to cross with a wire. Vessel appears to be a chronic total occlusion.  Vessel #3-obtuse marginal vessel  Percent stenosis (pre-) 100%  TIMI flow 0  Stent 3.0 x 28 mm Promus element  Percent stenosis (post) 0%  TIMI flow (post) 3  EKG: 18-Aug-2011 06:49:07  Normal sinus rhythm Non-specific ST-t changes No significant change since last tracing 08/17/11 25mm/s 10mm/mV 100Hz 8.0.1 12SL 241 HD CID: 0 Referred by: Confirmed By: JAMES HOCHREIN MD Vent. rate 87 BPM PR interval 160 ms QRS duration 74 ms QT/QTc 398/478 ms P-R-T axes 61 59  Echo: 08/17/2011 Study Conclusions - Left ventricle: Systolic function was normal. The estimated ejection fraction was in the range of 55% to 60%. Basal posterior and basal anterolateral hypokinesis. Features are consistent with a pseudonormal left ventricular filling pattern, with concomitant abnormal relaxation and increased filling pressure (grade 2 diastolic dysfunction). - Aortic valve: There was no stenosis. - Mitral valve: Mildly calcified annulus. Trivial regurgitation. - Left atrium: The atrium was mildly dilated. - Right ventricle: The cavity size was normal. Systolic function was normal. - Pulmonary arteries: No complete TR doppler jet so unable to estimate PA systolic pressure. - Systemic veins: IVC measured 2.0 cm with normal respirophasic variation, suggesting RA pressure 6-10 mmHg. Impressions: - Normal LV size and systolic function, EF 55-60%. Basal posterior and basal anterolateral hypokinesis. Moderate diastolic dysfunction. Normal RV size and  systolic function. No significant valvular dysfunction.  FOLLOW UP PLANS AND APPOINTMENTS No Known Allergies   Discharge Orders    Future Orders Please Complete By Expires   Amb Referral to Cardiac Rehabilitation        Follow-up Information    Follow up with [facility] [place]. (The office will call)    Contact information:   [address]  [place] [address]           BRING ALL MEDICATIONS WITH YOU TO FOLLOW UP APPOINTMENTS  Time spent with patient to include physician time: 39 min Signed: Braxtin Bamba 08/20/2011, 11:23 AM Co-Sign MD  

## 2011-08-20 NOTE — Discharge Summary (Signed)
CARDIOLOGY DISCHARGE SUMMARY   Patient ID: Manuel Nguyen MRN: [id number] DOB/AGE: 1950/04/25 61 y.o.  Admit date: 08/16/2011 Discharge date: 08/20/2011  Primary Discharge Diagnosis:   *STEMI (ST elevation myocardial infarction)  Secondary Discharge Diagnosis:  HTN (hypertension)  Hyperlipidemia  NSVT (nonsustained ventricular tachycardia)  Diabetes - Blood glucose abnormal  Consults: Cardiac rehabilitation, diabetes coordinator  Procedures: Left Heart Cath, Selective Coronary Angiography, LV angiography, PTCA and stenting of the proximal right coronary and the first obtuse marginal vessel.  Hospital Course: Manuel Nguyen is a 61 year old male with no previous history of coronary artery disease. He went to the [facility] emergency room with chest pain. He had ST elevation inferiorly as well as V5 and V6. He was emergently transferred to [facility] and taken to the Cath Lab.  The cardiac catheterization results and stenting results are described below. He tolerated the procedure well. His EF was preserved at catheterization and a follow-up echocardiogram confirmed this. His lipid profile and hemoglobin A1c are below. He was started on a statin and is to go on a diabetic diet. He is to followup with his primary care physician for this. He was started on a beta blocker as well. He was seen by cardiac rehabilitation. He has a remote history of tobacco use and continued cessation was encouraged. He he continued to improve.  On 08/20/2011, he was seen by Dr. Tenny Craw. He was ambulating without chest pain or shortness of breath. His medication profile was reviewed. No further changes were indicated. He is considered stable for discharge, to follow up as an outpatient (in [place] at his request).  Labs:   Lab Results  Component Value Date   WBC 9.8 08/17/2011   HGB 13.4 08/17/2011   HCT 38.5* 08/17/2011   MCV 87.3 08/17/2011   PLT 226 08/17/2011    Lab 08/18/11 0612 08/16/11 2218  NA 135 --  K  3.6 --  CL 102 --  CO2 22 --  BUN 9 --  CREATININE 1.12 --  CALCIUM 8.6 --  PROT -- 7.4  BILITOT -- 0.3  ALKPHOS -- 60  ALT -- 24  AST -- 63*  GLUCOSE 128* --    Basename 08/17/11 1739  CKTOTAL 1653*  CKMB 148.9*  CKMBINDEX --  TROPONINI >25.00*   Lipid Panel     Component Value Date/Time   CHOL 300* 08/17/2011 0954   TRIG 171* 08/17/2011 0954   HDL 40 08/17/2011 0954   CHOLHDL 7.5 08/17/2011 0954   VLDL 34 08/17/2011 0954   LDLCALC 226* 08/17/2011 0954   Lab Results  Component Value Date   HGBA1C 7.2* 08/16/2011      Radiology: Dg Chest Port 1 View 08/16/2011  *RADIOLOGY REPORT*  Clinical Data: Code STEMI.  Chest pain.  Former smoker with history of hypertension.  PORTABLE CHEST - 1 VIEW  08/16/2011 1953 hours:  Comparison: None.  Findings: Cardiac silhouette normal and mediastinal contours unremarkable for the AP portable technique.  Lungs clear. Pulmonary vascularity normal.  Bronchovascular markings normal.  No pneumothorax.  No pleural effusions.  IMPRESSION: No acute cardiopulmonary disease.  Original Report Authenticated By: Arnell Sieving, M.D.    Cardiac Cath: 08/16/2011 Left mainstem: Normal.  Left anterior descending (LAD): There are moderate irregularities in the mid to distal vessel up to 20%.  Left circumflex (LCx): There is a 40% stenosis at the ostium of the left circumflex. The first obtuse marginal vessel is occluded. The second marginal branch has irregularities up to 20%. The left  circumflex then continues in the AV groove to the distal posterior lateral area. There are mild irregularities in the distal vessel as well.  Right coronary artery (RCA): The right coronary is a dominant vessel. There is an 80-90% focal stenosis in the proximal vessel. In the mid vessel after the takeoff of the right ventricular marginal branch there is total occlusion. There are left-to-right collaterals to the far distal right coronary.  Left ventriculography: Left ventricular  systolic function abnormal. There is mid inferior wall hypokinesis. Overall systolic function is well preserved with an EF of 55%. PCI Data:  Vessel - RCA/Segment - proximal  Percent Stenosis (pre) 80-90%  TIMI-flow 3  Stent 3.0 x 12 mm Promus element  Percent Stenosis (post) 0%  TIMI-flow (post) 3  Vessel #2-RCA/segment mid  Percent stenosis (pre-) 100%  TIMI flow 0  Unable to cross with a wire. Vessel appears to be a chronic total occlusion.  Vessel #3-obtuse marginal vessel  Percent stenosis (pre-) 100%  TIMI flow 0  Stent 3.0 x 28 mm Promus element  Percent stenosis (post) 0%  TIMI flow (post) 3  EKG: 18-Aug-2011 06:49:07  Normal sinus rhythm Non-specific ST-t changes No significant change since last tracing 08/17/11 77mm/s 72mm/mV 100Hz  8.0.1 12SL 241 HD CID: 0 Referred by: Confirmed By: Rollene Rotunda MD Vent. rate 87 BPM PR interval 160 ms QRS duration 74 ms QT/QTc 398/478 ms P-R-T axes 61 59  Echo: 08/17/2011 Study Conclusions - Left ventricle: Systolic function was normal. The estimated ejection fraction was in the range of 55% to 60%. Basal posterior and basal anterolateral hypokinesis. Features are consistent with a pseudonormal left ventricular filling pattern, with concomitant abnormal relaxation and increased filling pressure (grade 2 diastolic dysfunction). - Aortic valve: There was no stenosis. - Mitral valve: Mildly calcified annulus. Trivial regurgitation. - Left atrium: The atrium was mildly dilated. - Right ventricle: The cavity size was normal. Systolic function was normal. - Pulmonary arteries: No complete TR doppler jet so unable to estimate PA systolic pressure. - Systemic veins: IVC measured 2.0 cm with normal respirophasic variation, suggesting RA pressure 6-10 mmHg. Impressions: - Normal LV size and systolic function, EF 55-60%. Basal posterior and basal anterolateral hypokinesis. Moderate diastolic dysfunction. Normal RV size and  systolic function. No significant valvular dysfunction.  FOLLOW UP PLANS AND APPOINTMENTS No Known Allergies   Discharge Orders    Future Orders Please Complete By Expires   Amb Referral to Cardiac Rehabilitation        Follow-up Information    Follow up with [facility]. (The office will call)    Contact information:   [address] [place] [address]           BRING ALL MEDICATIONS WITH YOU TO FOLLOW UP APPOINTMENTS  Time spent with patient to include physician time: 39 min Signed: Theodore Demark 08/20/2011, 11:23 AM Co-Sign MD

## 2011-08-20 NOTE — Progress Notes (Signed)
Subjective: NO CP. Objective: Filed Vitals:   08/19/11 1409 08/19/11 2100 08/20/11 0500 08/20/11 1007  BP: 123/71 117/71 110/62 124/72  Pulse: 90 88 81   Temp: 99.7 F (37.6 C) 98.4 F (36.9 C) 98.9 F (37.2 C)   TempSrc:  Oral Oral   Resp: 18 20 18    Height:      Weight:      SpO2: 97% 96% 97%    Weight change:  No intake or output data in the 24 hours ending 08/20/11 1015  General: Alert, awake, oriented x3, in no acute distress Neck:  JVP is normal Heart: Regular rate and rhythm, without murmurs, rubs, gallops.  Lungs: Clear to auscultation.  No rales or wheezes. Exemities:  No edema.   Neuro: Grossly intact, nonfocal.  Tele:  SR.  Lab Results: No results found for this or any previous visit (from the past 24 hour(s)).  Studies/Results: No results found.  Medications: I have reviewed the patient's current medications.   Patient Active Hospital Problem List: STEMI (ST elevation myocardial infarction) (08/16/2011)   Assessment: S/P PCI of prox RCA and OM1 with DES  Chronic occlusion of mid RCA.  Continue meds.   HTN (hypertension) (08/16/2011)   Assessment:     Hyperlipidemia (08/17/2011)   Assessment: High dose lipitor.  Will need to be followe.     NSVT (nonsustained ventricular tachycardia) (08/17/2011)   Assessment: No recurrence  DM  Hgb A1C was 7.2  I would see in clinic before starting an agent  Should really defer to primary MD.  Needs control  Patient admits to eatting a lot of sweets at home.  Will cut back  LOS: 4 days   Dietrich Pates 08/20/2011, 10:15 AM

## 2011-08-23 ENCOUNTER — Telehealth: Payer: Self-pay | Admitting: Cardiology

## 2011-08-23 MED ORDER — NITROGLYCERIN 0.4 MG SL SUBL: 0.4000 mg | SUBLINGUAL_TABLET | SUBLINGUAL | Status: DC | PRN

## 2011-08-23 NOTE — Telephone Encounter (Signed)
Patient called wanting NTG prescription sent to [facility] [place].

## 2011-08-23 NOTE — Telephone Encounter (Signed)
Please return call to patient at [phone] regarding Nitro pills prescribed for him while he was in the hospital.  I checked the medlist and I have not found that RX.  Patient asks that nurse leave msg on vm  To advise if he is not available. Verified preferred pharamacy [facility], [place]

## 2011-08-26 ENCOUNTER — Telehealth: Payer: Self-pay | Admitting: Cardiology

## 2011-08-26 NOTE — Telephone Encounter (Signed)
Patient called phone rings busy. 

## 2011-08-26 NOTE — Telephone Encounter (Signed)
Patient called was told [facility] did not receive income information.Patient will fax to [facility] his income information.Patient was told to include his reference # [id number] and fax to [facility] fax # [phone].

## 2011-08-26 NOTE — Telephone Encounter (Signed)
Fu call °Patient returning your call °

## 2011-08-27 DIAGNOSIS — I2510 Atherosclerotic heart disease of native coronary artery without angina pectoris: Secondary | ICD-10-CM

## 2011-08-27 HISTORY — DX: Atherosclerotic heart disease of native coronary artery without angina pectoris: I25.10

## 2011-10-17 ENCOUNTER — Encounter: Payer: Self-pay | Admitting: Adult Health

## 2011-10-20 ENCOUNTER — Encounter: Payer: Self-pay | Admitting: Adult Health

## 2011-10-20 ENCOUNTER — Ambulatory Visit (INDEPENDENT_AMBULATORY_CARE_PROVIDER_SITE_OTHER): Payer: PRIVATE HEALTH INSURANCE | Admitting: Adult Health

## 2011-10-20 VITALS — BP 130/80 | HR 70 | Ht 69.0 in | Wt 216.2 lb

## 2011-10-20 DIAGNOSIS — I219 Acute myocardial infarction, unspecified: Secondary | ICD-10-CM

## 2011-10-20 DIAGNOSIS — I213 ST elevation (STEMI) myocardial infarction of unspecified site: Secondary | ICD-10-CM

## 2011-10-20 DIAGNOSIS — E785 Hyperlipidemia, unspecified: Secondary | ICD-10-CM

## 2011-10-20 DIAGNOSIS — I10 Essential (primary) hypertension: Secondary | ICD-10-CM

## 2011-10-20 MED ORDER — ROSUVASTATIN CALCIUM 10 MG PO TABS: 5.0000 mg | ORAL_TABLET | Freq: Every day | ORAL | Status: DC

## 2011-10-20 MED ORDER — PRASUGREL HCL 10 MG PO TABS: 10.0000 mg | ORAL_TABLET | Freq: Every day | ORAL | Status: DC

## 2011-10-20 NOTE — Assessment & Plan Note (Addendum)
Excellent control of blood pressure on current medication regimen. He denies any fatigue associated with Lopressor, or lisinopril. Continue risk modification with followup labs. Review of labs collected on 10/11/2011 demonstrated a creatinine of 1.28; sodium sodium 139, potassium 4.1 LFT were within normal limits.

## 2011-10-20 NOTE — Assessment & Plan Note (Signed)
He is not tolerating Lipitor very well due to nausea. IMP being him samples of Crestor 10 mg which she is to take one half tablet. We will evaluate if this is a class effect for him or only related to Lipitor. If the samples do not bother him we may consider starting him on this or another statin that is more affordable on the [facility] formulary. He will have followup lipids and LFTs in 3 months prior to the next visit. I have reviewed his current lipid profile with him: TC: 203; TG104; HDL 35; LDL 147.

## 2011-10-20 NOTE — Assessment & Plan Note (Signed)
He is doing well. He offers no complaints of recurrent chest pain. 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 He denies any issues with shortness of breath or dizziness on his medications. He has not retained fluid. He has not complained of any palpitations. His main complaint is nausea associated with use of Lipitor. He has stopped taking it for a day or 2 and the nausea did not go away.  He will continue on effient, I have given him samples along with a discount card for refills. He will remain on dual antiplatelet therapy for a minimum of one year. We will see him again in 3 months for ongoing assessment.

## 2011-10-20 NOTE — Patient Instructions (Addendum)
Stop taking Lipitor (atorvastatin).  Start taking Crestor 5mg  (1/2 tablet) daily with evening meal.  Your physician recommends that you schedule a follow-up appointment in 3 months with Joni Reining, NP.

## 2011-10-20 NOTE — Progress Notes (Signed)
   HPI: Mr. Kellman is a 61 year old African American male patient we are following post hospitalization to be est. in [place] office after ST elevated MI on 08/16/2011. He had emergent cardiac catheterization completed per Dr. Peter Swaziland. This demonstrated severe two-vessel obstructive coronary artery disease in the first obtuse marginal vessel thought to be the culprit lesion) but also severe stenosis in the proximal right coronary artery and a chronic total occlusion of the mid right coronary. He had stenting of the proximal right coronary artery with a drug-eluting stent, and stenting of the first obtuse marginal vessel. He was placed on dual antiplatelet therapy with Effient  and aspirin. He was also started on Lipitor 80 mg daily, but has not tolerated this well secondary to nausea. He denies any myalgias. He has return to work and has had no further cardiac complaints. He is medically compliant. He is committed to adhering to a low cholesterol diet and eating more cognizant of his health status.  No Known Allergies  Current Outpatient Prescriptions  Medication Sig Dispense Refill  . aspirin EC 81 MG EC tablet Take 1 tablet (81 mg total) by mouth daily.      Marland Kitchen lisinopril (PRINIVIL,ZESTRIL) 2.5 MG tablet Take 1 tablet (2.5 mg total) by mouth 2 (two) times daily.  30 tablet  11  . metoprolol (LOPRESSOR) 50 MG tablet Take 1 tablet (50 mg total) by mouth 2 (two) times daily.  60 tablet  11  . nitroGLYCERIN (NITROSTAT) 0.4 MG SL tablet Place 1 tablet (0.4 mg total) under the tongue every 5 (five) minutes as needed for chest pain.  25 tablet  11  . prasugrel (EFFIENT) 10 MG TABS Take 1 tablet (10 mg total) by mouth daily.  30 tablet  11  . rosuvastatin (CRESTOR) 10 MG tablet Take 0.5 tablets (5 mg total) by mouth daily.  30 tablet  11  . DISCONTD: rosuvastatin (CRESTOR) 10 MG tablet Take 0.5 tablets (5 mg total) by mouth daily.  90 tablet  3    Past Medical History  Diagnosis Date  .  Hypertension   . Hyperlipemia      GUY:QIHKVQ of systems complete and found to be negative unless listed above  PHYSICAL EXAM BP 130/80  Pulse 70  Ht 5\' 9"  (1.753 m)  Wt 216 lb 4 oz (98.09 kg)  BMI 31.93 kg/m2  General: Well developed, well nourished, in no acute distress Head: Eyes PERRLA, No xanthomas.   Normal cephalic and atramatic  Lungs: Clear bilaterally to auscultation and percussion. Heart: HRRR S1 S2, without MRG.  Pulses are 2+ & equal.            No carotid bruit. No JVD.  No abdominal bruits. No femoral bruits. Abdomen: Bowel sounds are positive, abdomen soft and non-tender without masses or                  Hernia's noted. Msk:  Back normal, normal gait. Normal strength and tone for age. Extremities: No clubbing, cyanosis or edema.  DP +1 Neuro: Alert and oriented X 3. Psych:  Good affect, responds appropriately  EKG:  NSR rate of 70 bpm.:  ASSESSMENT AND PLAN

## 2012-02-22 ENCOUNTER — Other Ambulatory Visit (HOSPITAL_COMMUNITY): Payer: Self-pay | Admitting: Physician Assistant

## 2012-05-31 ENCOUNTER — Telehealth: Payer: Self-pay | Admitting: Adult Health

## 2012-05-31 NOTE — Telephone Encounter (Signed)
Patient came in and requested samples of Effient.  Patient was given samples by nurse and appointment was made for patient to come in next week.

## 2012-06-05 ENCOUNTER — Encounter: Payer: Self-pay | Admitting: Adult Health

## 2012-06-05 ENCOUNTER — Ambulatory Visit (INDEPENDENT_AMBULATORY_CARE_PROVIDER_SITE_OTHER): Payer: Self-pay | Admitting: Adult Health

## 2012-06-05 VITALS — BP 144/84 | HR 77 | Ht 69.0 in | Wt 221.0 lb

## 2012-06-05 DIAGNOSIS — I10 Essential (primary) hypertension: Secondary | ICD-10-CM

## 2012-06-05 DIAGNOSIS — I219 Acute myocardial infarction, unspecified: Secondary | ICD-10-CM

## 2012-06-05 DIAGNOSIS — I213 ST elevation (STEMI) myocardial infarction of unspecified site: Secondary | ICD-10-CM

## 2012-06-05 DIAGNOSIS — E785 Hyperlipidemia, unspecified: Secondary | ICD-10-CM

## 2012-06-05 NOTE — Assessment & Plan Note (Signed)
Mildly elevated today. He says he is nervous about being here.  Will not make any changes.

## 2012-06-05 NOTE — Assessment & Plan Note (Signed)
Followed by [facility] for labs. Had them done last month. He will bring [place] a copy.  Now on lovastatin.

## 2012-06-05 NOTE — Patient Instructions (Addendum)
Continue same medications.   Your physician wants you to follow-up in: 6 months.  You will receive a reminder letter in the mail two months in advance. If you don't receive a letter, please call our office to schedule the follow-up appointment.  

## 2012-06-05 NOTE — Progress Notes (Signed)
   [name] is a 62 year old African American male patient we are following post hospitalization to be est. in [place] office after ST elevated MI on 08/16/2011. He had emergent cardiac catheterization completed per Dr. Peter Swaziland. This demonstrated severe two-vessel obstructive coronary artery disease in the first obtuse marginal vessel thought to be the culprit lesion) but also severe stenosis in the proximal right coronary artery and a chronic total occlusion of the mid right coronary. He had stenting of the proximal right coronary artery with a drug-eluting stent, and stenting of the first obtuse marginal vessel. He was placed on dual antiplatelet therapy with Effient and aspirin.Had to change lovastatin due pain in muscles.   No Known Allergies  Current Outpatient Prescriptions  Medication Sig Dispense Refill  . aspirin EC 81 MG EC tablet Take 1 tablet (81 mg total) by mouth daily.      Marland Kitchen losartan (COZAAR) 50 MG tablet Take 50 mg by mouth daily.      . metoprolol (LOPRESSOR) 50 MG tablet Take 1 tablet (50 mg total) by mouth 2 (two) times daily.  60 tablet  11  . nitroGLYCERIN (NITROSTAT) 0.4 MG SL tablet Place 1 tablet (0.4 mg total) under the tongue every 5 (five) minutes as needed for chest pain.  25 tablet  11  . prasugrel (EFFIENT) 10 MG TABS Take 1 tablet (10 mg total) by mouth daily.  30 tablet  11  . valsartan (DIOVAN) 40 MG tablet Take 40 mg by mouth daily.       No current facility-administered medications for this visit.    Past Medical History  Diagnosis Date  . Hypertension   . Hyperlipemia   . Coronary artery disease 08/2011    With drug-eluting stent to right coronary artery and obtuse marginal vessel    Past Surgical History  Procedure Laterality Date  . Cardiac catheterization      Drug-eluting stent to proximal right coronary artery and first acute marginal vessel    ZOX:WRUEAV of systems complete and found to be negative unless listed above  PHYSICAL  EXAM BP 144/84  Pulse 77  Ht 5\' 9"  (1.753 m)  Wt 221 lb (100.245 kg)  BMI 32.62 kg/m2 General: Well developed, well nourished, in no acute distress Head: Eyes PERRLA, No xanthomas.   Normal cephalic and atramatic  Lungs: Clear bilaterally to auscultation and percussion. Heart: HRRR S1 S2, without MRG.  Pulses are 2+ & equal.            No carotid bruit. No JVD.  No abdominal bruits. No femoral bruits. Abdomen: Bowel sounds are positive, abdomen soft and non-tender without masses or                  Hernia's noted. Msk:  Back normal, normal gait. Normal strength and tone for age. Extremities: No clubbing, cyanosis or edema.  DP +1 Neuro: Alert and oriented X 3. Psych:  Good affect, responds appropriately  EKG: NSR rate of 77 bpm.  ASSESSMENT AND PLAN

## 2012-06-05 NOTE — Assessment & Plan Note (Signed)
No further complaints of chest pain. He is medically compliant. He will need to stay on Effient until July of 2014

## 2012-06-29 ENCOUNTER — Telehealth: Payer: Self-pay | Admitting: *Deleted

## 2012-06-29 NOTE — Telephone Encounter (Signed)
Pt came into office to check on effient samples, at time of walk in, noted samples received, left message on pt home number to advise the samples have been placed up front for pick up

## 2012-07-03 ENCOUNTER — Encounter: Payer: Self-pay | Admitting: Adult Health

## 2012-07-03 ENCOUNTER — Telehealth: Payer: Self-pay | Admitting: *Deleted

## 2012-07-03 MED ORDER — PRAVASTATIN SODIUM 40 MG PO TABS: 40.0000 mg | ORAL_TABLET | Freq: Every evening | ORAL | Status: DC

## 2012-07-03 NOTE — Telephone Encounter (Signed)
Confirmed with the pharmacy that he is on Losartan, not Valsartan.  Message left with him to return call to advise him to begin pravachol 40 mg daily, due to his recent cholesterol and values will be scanned into EPIC.

## 2012-07-04 NOTE — Telephone Encounter (Signed)
PCP has already started patient on pravastatin 40 mg and has plans to recheck cholesterol in June and he will forward a copy to [place].

## 2012-08-23 ENCOUNTER — Other Ambulatory Visit (HOSPITAL_COMMUNITY): Payer: Self-pay | Admitting: Physician Assistant

## 2012-08-24 ENCOUNTER — Other Ambulatory Visit: Payer: Self-pay

## 2012-08-31 ENCOUNTER — Other Ambulatory Visit: Payer: Self-pay | Admitting: *Deleted

## 2012-08-31 MED ORDER — METOPROLOL TARTRATE 50 MG PO TABS: 50.0000 mg | ORAL_TABLET | Freq: Two times a day (BID) | ORAL | Status: DC

## 2012-08-31 MED ORDER — NITROGLYCERIN 0.4 MG SL SUBL: 0.4000 mg | SUBLINGUAL_TABLET | SUBLINGUAL | Status: DC | PRN

## 2012-08-31 NOTE — Telephone Encounter (Signed)
Fax Received. Refill Completed. Manuel Nguyen (R.M.A)   

## 2012-10-01 ENCOUNTER — Encounter: Payer: Self-pay | Admitting: Adult Health

## 2012-10-08 ENCOUNTER — Telehealth: Payer: Self-pay | Admitting: *Deleted

## 2012-10-08 NOTE — Telephone Encounter (Signed)
Please confirm the pt can stop the Effient based on notation from pt OV 06-05-12

## 2012-10-08 NOTE — Telephone Encounter (Signed)
.  left message to have patient return my call.  

## 2012-10-08 NOTE — Telephone Encounter (Signed)
Pt came into office today for effient samples. When looking up on his records it states that he can come off effient in July 2014. I did not give samples he has one week left at home to take.   Just need written confirmation he can come off. Please call patient and let him know either way. Thanks

## 2012-10-08 NOTE — Telephone Encounter (Signed)
Ok to stop Effient .

## 2012-10-09 NOTE — Telephone Encounter (Signed)
Spoke to pt to advise results/instructions. Pt understood.  

## 2012-12-17 ENCOUNTER — Encounter: Payer: Self-pay | Admitting: Adult Health

## 2012-12-17 ENCOUNTER — Ambulatory Visit (INDEPENDENT_AMBULATORY_CARE_PROVIDER_SITE_OTHER): Payer: Self-pay | Admitting: Adult Health

## 2012-12-17 VITALS — BP 148/81 | HR 98 | Ht 69.0 in | Wt 225.0 lb

## 2012-12-17 DIAGNOSIS — I10 Essential (primary) hypertension: Secondary | ICD-10-CM

## 2012-12-17 DIAGNOSIS — E785 Hyperlipidemia, unspecified: Secondary | ICD-10-CM

## 2012-12-17 DIAGNOSIS — I213 ST elevation (STEMI) myocardial infarction of unspecified site: Secondary | ICD-10-CM

## 2012-12-17 DIAGNOSIS — E119 Type 2 diabetes mellitus without complications: Secondary | ICD-10-CM

## 2012-12-17 DIAGNOSIS — I219 Acute myocardial infarction, unspecified: Secondary | ICD-10-CM

## 2012-12-17 NOTE — Assessment & Plan Note (Signed)
I have given him sample of Zetia for one month. Advised him on a low cholesterol diet and he is told to exercise for 30 minutes each day. He is to see [place] again in 6 months unless symptomatic.

## 2012-12-17 NOTE — Patient Instructions (Signed)
Your physician recommends that you schedule a follow-up appointment in: 6 months You will receive a reminder letter two months in advance reminding you to call and schedule your appointment. If you don't receive this letter, please contact our office.  Gave samples of Zetia. Please call office in a few weeks to let us know how it is working.

## 2012-12-17 NOTE — Assessment & Plan Note (Signed)
He has had no recurrent chest pain. Is medically compliant with all other medications but the pravastatin. Effient has been discontinued. I have advised that he increase his exercise regimen to 30 minutes a day to assist with weight loss and help to lower cholesterol.

## 2012-12-17 NOTE — Progress Notes (Deleted)
Name: Manuel Nguyen    DOB: September 22, 1950  Age: 62 y.o.  MR#: [id number]       PCP:  No primary provider on file.      Insurance: Payor: / No coverage found.  CC:    Chief Complaint  Patient presents with  . Hypertension  . Coronary Artery Disease    VS Filed Vitals:   12/17/12 1428  BP: 148/81  Pulse: 98  Height: 5\' 9"  (1.753 m)  Weight: 225 lb (102.059 kg)    Weights Current Weight  12/17/12 225 lb (102.059 kg)  06/05/12 221 lb (100.245 kg)  10/20/11 216 lb 4 oz (98.09 kg)    Blood Pressure  BP Readings from Last 3 Encounters:  12/17/12 148/81  06/05/12 144/84  10/20/11 130/80     Admit date:  (Not on file) Last encounter with RMR:  Visit date not found   Allergy Review of patient's allergies indicates no known allergies.  Current Outpatient Prescriptions  Medication Sig Dispense Refill  . losartan (COZAAR) 50 MG tablet Take 50 mg by mouth daily.      . metoprolol (LOPRESSOR) 50 MG tablet Take 1 tablet (50 mg total) by mouth 2 (two) times daily.  60 tablet  6  . nitroGLYCERIN (NITROSTAT) 0.4 MG SL tablet Place 1 tablet (0.4 mg total) under the tongue every 5 (five) minutes as needed for chest pain.  25 tablet  6  . pravastatin (PRAVACHOL) 40 MG tablet Take 1 tablet (40 mg total) by mouth every evening.  30 tablet  6   No current facility-administered medications for this visit.    Discontinued Meds:    Medications Discontinued During This Encounter  Medication Reason  . EFFIENT 10 MG TABS Error  . prasugrel (EFFIENT) 10 MG TABS Error    Patient Active Problem List   Diagnosis Date Noted  . Diabetes mellitus 08/20/2011  . Hyperlipidemia 08/17/2011  . NSVT (nonsustained ventricular tachycardia) 08/17/2011  . Blood glucose abnormal 08/17/2011  . STEMI (ST elevation myocardial infarction) 08/16/2011  . HTN (hypertension) 08/16/2011    LABS    Component Value Date/Time   NA 135 08/18/2011 0612   NA 133* 08/17/2011 0428   NA 134* 08/16/2011 2218   K 3.6  08/18/2011 0612   K 3.9 08/17/2011 0428   K 4.2 08/16/2011 2218   CL 102 08/18/2011 0612   CL 101 08/17/2011 0428   CL 101 08/16/2011 2218   CO2 22 08/18/2011 0612   CO2 21 08/17/2011 0428   CO2 21 08/16/2011 2218   GLUCOSE 128* 08/18/2011 0612   GLUCOSE 193* 08/17/2011 0428   GLUCOSE 191* 08/16/2011 2218   BUN 9 08/18/2011 0612   BUN 14 08/17/2011 0428   BUN 16 08/16/2011 2218   CREATININE 1.12 08/18/2011 0612   CREATININE 0.97 08/17/2011 0428   CREATININE 1.14 08/16/2011 2218   CALCIUM 8.6 08/18/2011 0612   CALCIUM 8.6 08/17/2011 0428   CALCIUM 9.2 08/16/2011 2218   GFRNONAA 69* 08/18/2011 0612   GFRNONAA 87* 08/17/2011 0428   GFRNONAA 68* 08/16/2011 2218   GFRAA 80* 08/18/2011 0612   GFRAA >90 08/17/2011 0428   GFRAA 78* 08/16/2011 2218   CMP     Component Value Date/Time   NA 135 08/18/2011 0612   K 3.6 08/18/2011 0612   CL 102 08/18/2011 0612   CO2 22 08/18/2011 0612   GLUCOSE 128* 08/18/2011 0612   BUN 9 08/18/2011 0612   CREATININE 1.12 08/18/2011 0612   CALCIUM  8.6 08/18/2011 0612   PROT 7.4 08/16/2011 2218   ALBUMIN 3.8 08/16/2011 2218   AST 63* 08/16/2011 2218   ALT 24 08/16/2011 2218   ALKPHOS 60 08/16/2011 2218   BILITOT 0.3 08/16/2011 2218   GFRNONAA 69* 08/18/2011 0612   GFRAA 80* 08/18/2011 0612       Component Value Date/Time   WBC 9.8 08/17/2011 0428   WBC 8.7 08/16/2011 2218   WBC 11.2* 08/16/2011 1953   HGB 13.4 08/17/2011 0428   HGB 13.4 08/16/2011 2218   HGB 14.0 08/16/2011 1953   HCT 38.5* 08/17/2011 0428   HCT 39.8 08/16/2011 2218   HCT 41.7 08/16/2011 1953   MCV 87.3 08/17/2011 0428   MCV 87.3 08/16/2011 2218   MCV 88.7 08/16/2011 1953    Lipid Panel     Component Value Date/Time   CHOL 300* 08/17/2011 0954   TRIG 171* 08/17/2011 0954   HDL 40 08/17/2011 0954   CHOLHDL 7.5 08/17/2011 0954   VLDL 34 08/17/2011 0954   LDLCALC 226* 08/17/2011 0954    ABG No results found for this basename: phart, pco2, pco2art, po2, po2art, hco3, tco2, acidbasedef, o2sat     Lab Results   Component Value Date   TSH 0.751 08/16/2011   BNP (last 3 results) No results found for this basename: PROBNP,  in the last 8760 hours Cardiac Panel (last 3 results) No results found for this basename: CKTOTAL, CKMB, TROPONINI, RELINDX,  in the last 72 hours  Iron/TIBC/Ferritin No results found for this basename: iron, tibc, ferritin     EKG Orders placed in visit on 06/05/12  . EKG 12-LEAD     Prior Assessment and Plan Problem List as of 12/17/2012     Cardiovascular and Mediastinum   STEMI (ST elevation myocardial infarction)   Last Assessment & Plan   06/05/2012 Office Visit Written 06/05/2012  2:42 PM by Jodelle Gross, NP     No further complaints of chest pain. He is medically compliant. He will need to stay on Effient until July of 2014    HTN (hypertension)   Last Assessment & Plan   06/05/2012 Office Visit Written 06/05/2012  2:41 PM by Jodelle Gross, NP     Mildly elevated today. He says he is nervous about being here.  Will not make any changes.    NSVT (nonsustained ventricular tachycardia)     Endocrine   Diabetes mellitus     Other   Hyperlipidemia   Last Assessment & Plan   06/05/2012 Office Visit Written 06/05/2012  2:43 PM by Jodelle Gross, NP     Followed by [facility] for labs. Had them done last month. He will bring [place] a copy.  Now on lovastatin.    Blood glucose abnormal       Imaging: No results found.

## 2012-12-17 NOTE — Progress Notes (Signed)
   HPI: Mr. Severe is a 62 year old former patient of Dr. Dietrich Pates, we are following for ongoing assessment and management of CAD, status post ST elevation MI in May 2013, with severe two-vessel CAD, drug-eluting stent to the proximal right coronary artery and first acute marginal vessel. He remains on dual antiplatelet therapy with Effient and aspirin. He was last seen in the office in March of 2014. At that time stable, no changes in his medication. Effient has been discontinued as it has been over a year since DES placement.   He has stopped taking Pravastatin due to myalgia's. Once stopped, pain has subsided and he is feeling much more energetic.      No Known Allergies  Current Outpatient Prescriptions  Medication Sig Dispense Refill  . losartan (COZAAR) 50 MG tablet Take 50 mg by mouth daily.      . metoprolol (LOPRESSOR) 50 MG tablet Take 1 tablet (50 mg total) by mouth 2 (two) times daily.  60 tablet  6  . nitroGLYCERIN (NITROSTAT) 0.4 MG SL tablet Place 1 tablet (0.4 mg total) under the tongue every 5 (five) minutes as needed for chest pain.  25 tablet  6   No current facility-administered medications for this visit.    Past Medical History  Diagnosis Date  . Hypertension   . Hyperlipemia   . Coronary artery disease 08/2011    With drug-eluting stent to right coronary artery and obtuse marginal vessel    Past Surgical History  Procedure Laterality Date  . Cardiac catheterization      Drug-eluting stent to proximal right coronary artery and first acute marginal vessel    ZOX:WRUEAV of systems complete and found to be negative unless listed above PHYSICAL EXAM BP 148/81  Pulse 98  Ht 5\' 9"  (1.753 m)  Wt 225 lb (102.059 kg)  BMI 33.21 kg/m2  General: Well developed, well nourished, in no acute distress Head: Eyes PERRLA, No xanthomas.   Normal cephalic and atramatic  Lungs: Clear bilaterally to auscultation and percussion. Heart: HRRR S1 S2, without MRG.  Pulses are 2+ &  equal.            No carotid bruit. No JVD.  No abdominal bruits. No femoral bruits. Abdomen: Bowel sounds are positive, abdomen soft and non-tender without masses or                  Hernia's noted. Msk:  Back normal, normal gait. Normal strength and tone for age. Extremities: No clubbing, cyanosis or edema.  DP +1 Neuro: Alert and oriented X 3. Psych:  Good affect, responds appropriately    ASSESSMENT AND PLAN

## 2012-12-17 NOTE — Assessment & Plan Note (Signed)
A1C report he brings with him 7.3 on 12/07/2012. He is reminded to watch carb rich foods.

## 2012-12-17 NOTE — Assessment & Plan Note (Signed)
Excellent control of BP at this time. BP at home is usually 120/70 range. I will not adjust his medications at this time.

## 2013-04-24 ENCOUNTER — Other Ambulatory Visit: Payer: Self-pay | Admitting: Adult Health

## 2013-08-06 ENCOUNTER — Ambulatory Visit (INDEPENDENT_AMBULATORY_CARE_PROVIDER_SITE_OTHER): Payer: Self-pay | Admitting: Urology

## 2013-08-06 DIAGNOSIS — R3129 Other microscopic hematuria: Secondary | ICD-10-CM

## 2013-09-04 ENCOUNTER — Ambulatory Visit (INDEPENDENT_AMBULATORY_CARE_PROVIDER_SITE_OTHER): Payer: Self-pay | Admitting: Cardiovascular Disease

## 2013-09-04 VITALS — BP 110/58 | HR 89 | Ht 69.0 in | Wt 224.0 lb

## 2013-09-04 DIAGNOSIS — K219 Gastro-esophageal reflux disease without esophagitis: Secondary | ICD-10-CM

## 2013-09-04 DIAGNOSIS — I2510 Atherosclerotic heart disease of native coronary artery without angina pectoris: Secondary | ICD-10-CM

## 2013-09-04 DIAGNOSIS — Z9861 Coronary angioplasty status: Secondary | ICD-10-CM

## 2013-09-04 DIAGNOSIS — I10 Essential (primary) hypertension: Secondary | ICD-10-CM

## 2013-09-04 DIAGNOSIS — Z955 Presence of coronary angioplasty implant and graft: Secondary | ICD-10-CM

## 2013-09-04 DIAGNOSIS — E785 Hyperlipidemia, unspecified: Secondary | ICD-10-CM

## 2013-09-04 DIAGNOSIS — E119 Type 2 diabetes mellitus without complications: Secondary | ICD-10-CM

## 2013-09-04 DIAGNOSIS — I214 Non-ST elevation (NSTEMI) myocardial infarction: Secondary | ICD-10-CM

## 2013-09-04 MED ORDER — EZETIMIBE 10 MG PO TABS: 10.0000 mg | ORAL_TABLET | Freq: Every day | ORAL | Status: DC

## 2013-09-04 NOTE — Patient Instructions (Signed)
Your physician recommends that you schedule a follow-up appointment in: 6  Months with Dr Virgina Jock will receive a reminder letter two months in advance reminding you to call and schedule your appointment. If you don't receive this letter, please contact our office.  Your physician has recommended you make the following change in your medication:   Start Zetia 10 mg daily

## 2013-09-04 NOTE — Progress Notes (Signed)
Patient ID: Manuel Nguyen, male   DOB: [birth year], 63 y.o.   MRN: [id number]      SUBJECTIVE: The patient is a 63 yr old male with a history of an inferolateral STEMI in May 2013, for which he underwent drug-eluting stent placement to the first obtuse marginal branch (deemed to be culprit vessel) and proximal RCA. 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 He has been doing well. He has been intolerant to both Crestor and pravastatin in the past, as it led to myalgias. He may experience chest discomfort after eating greasy foods, and describes it as a burning type of pain. He denies exertional dyspnea, palpitations, leg swelling, orthopnea, and paroxysmal nocturnal dyspnea. He had been given samples of Z. He at his last visit, as he could not afford it, causing him over $300 per month.  Lipid panel on 05/08/2013 demonstrated total cholesterol 306, triglycerides 185, HDL 39, and LDL 230. BUN 13, creatinine 1.2, sodium 137, potassium 4.4, chloride 104, bicarbonate 26, AST 23, ALT 26. HbA1c 6.7%.  ECG today demonstrates normal sinus rhythm with prior inferior and lateral infarcts.  He works as a Animal nutritionist.   No Known Allergies  Current Outpatient Prescriptions  Medication Sig Dispense Refill  . losartan (COZAAR) 50 MG tablet Take 50 mg by mouth daily.      . metoprolol (LOPRESSOR) 50 MG tablet TAKE ONE TABLET BY MOUTH TWICE DAILY  60 tablet  6  . nitroGLYCERIN (NITROSTAT) 0.4 MG SL tablet Place 1 tablet (0.4 mg total) under the tongue every 5 (five) minutes as needed for chest pain.  25 tablet  6   No current facility-administered medications for this visit.    Past Medical History  Diagnosis Date  . Hypertension   . Hyperlipemia   . Coronary artery disease 08/2011    With drug-eluting stent to right coronary artery and obtuse marginal vessel    Past Surgical History  Procedure Laterality Date  .  Cardiac catheterization      Drug-eluting stent to proximal right coronary artery and first acute marginal vessel    History   Social History  . Marital Status: Divorced    Spouse Name: N/A    Number of Children: N/A  . Years of Education: N/A   Occupational History  . Not on file.   Social History Main Topics  . Smoking status: Former Smoker    Types: Cigarettes    Quit date: 08/15/1985  . Smokeless tobacco: Not on file  . Alcohol Use: Yes     Comment: little use  . Drug Use: No  . Sexual Activity:    Other Topics Concern  . Not on file   Social History Narrative  . No narrative on file     Filed Vitals:   09/04/13 1300  Height: 5\' 9"  (1.753 m)  Weight: 224 lb (101.606 kg)    BP 110/58  Pulse 89   PHYSICAL EXAM General: NAD Neck: No JVD, no thyromegaly. Lungs: Clear to auscultation bilaterally with normal respiratory effort. CV: Nondisplaced PMI.  Regular rate and rhythm, normal S1/S2, no S3/S4, no murmur. No pretibial or periankle edema.  No carotid bruit.  Normal pedal pulses.  Abdomen: Soft, nontender, no hepatosplenomegaly, no distention.  Neurologic: Alert and oriented x 3.  Psych: Normal affect. Extremities: No clubbing or cyanosis.   ECG: reviewed and available in electronic records.      ASSESSMENT  AND PLAN: 1. CAD: Symptomatically stable.he takes the aspirin on occasion, as it causes him some abdominal discomfort. Continue metoprolol 50 mg twice daily. I will see if he will qualify for the patient assistance program for Zetia. Otherwise, I would recommend PCSK-9 inhibitors once they come to market. 2. HTN: Well controlled on current therapy. 3. Hyperlipidemia: Intolerant to pravastatin and Crestor in the past, which caused myalgias. Total cholesterol and LDL are markedly elevated. I will see if he will qualify for the patient assistance program for Zetia. Otherwise, I would recommend PCSK-9 inhibitors once they come to market. 4. GERD: He  appears to have symptoms consistent with heartburn related to gastroesophageal reflux disease. I counseled him on dietary habits.  Dispo: f/u 6 months.  Kate Sable, M.D., F.A.C.C.

## 2013-09-09 ENCOUNTER — Encounter: Payer: Self-pay | Admitting: Adult Health

## 2014-01-30 ENCOUNTER — Other Ambulatory Visit: Payer: Self-pay

## 2014-01-30 MED ORDER — METOPROLOL TARTRATE 50 MG PO TABS: 50.0000 mg | ORAL_TABLET | Freq: Two times a day (BID) | ORAL | Status: DC

## 2014-03-06 ENCOUNTER — Encounter (HOSPITAL_COMMUNITY): Payer: Self-pay | Admitting: Cardiology

## 2014-04-04 ENCOUNTER — Encounter: Payer: Self-pay | Admitting: Cardiovascular Disease

## 2014-04-04 ENCOUNTER — Ambulatory Visit (INDEPENDENT_AMBULATORY_CARE_PROVIDER_SITE_OTHER): Payer: Self-pay | Admitting: Cardiovascular Disease

## 2014-04-04 VITALS — BP 125/68 | HR 76 | Ht 69.0 in | Wt 225.0 lb

## 2014-04-04 DIAGNOSIS — I25118 Atherosclerotic heart disease of native coronary artery with other forms of angina pectoris: Secondary | ICD-10-CM

## 2014-04-04 DIAGNOSIS — E785 Hyperlipidemia, unspecified: Secondary | ICD-10-CM

## 2014-04-04 DIAGNOSIS — I10 Essential (primary) hypertension: Secondary | ICD-10-CM

## 2014-04-04 MED ORDER — NITROGLYCERIN 0.4 MG SL SUBL: 0.4000 mg | SUBLINGUAL_TABLET | SUBLINGUAL | Status: DC | PRN

## 2014-04-04 NOTE — Patient Instructions (Addendum)
Your physician wants you to follow-up in: 6 months You will receive a reminder letter in the mail two months in advance. If you don't receive a letter, please call our office to schedule the follow-up appointment.  A nurse from the drug company will send someone to your home to show you how to use Praluent   Please get lab work 8 weeks after starting drug   I have placed an order for nitroglycerine at your pharmacy for you  Thank you for choosing [facility] !             Marland Kitchen

## 2014-04-04 NOTE — Progress Notes (Signed)
Patient ID: Manuel Nguyen, male   DOB: 1950-09-13, 64 y.o.   MRN: [id number]      SUBJECTIVE: The patient presents for routine follow up. 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 He has been intolerant to both Crestor and pravastatin in the past, as it led to myalgias. I started him on Zetia. Lipids on 01/09/2014 demonstrated total cholesterol 266, Manuel Nguyen was 154, HDL 36, LDL 199.  He denies shortness of breath and palpitations as well as leg swelling. He has occasional chest pains occurring approximately once per week which spontaneously resolve.     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   Review of Systems: As per "subjective", otherwise negative.  No Known Allergies  Current Outpatient Prescriptions  Medication Sig Dispense Refill  . ezetimibe (ZETIA) 10 MG tablet Take 1 tablet (10 mg total) by mouth daily. 90 tablet 3  . losartan (COZAAR) 50 MG tablet Take 50 mg by mouth daily.    . metoprolol (LOPRESSOR) 50 MG tablet Take 1 tablet (50 mg total) by mouth 2 (two) times daily. 60 tablet 6   No current facility-administered medications for this visit.    Past Medical History  Diagnosis Date  . Hypertension   . Hyperlipemia   . Coronary artery disease 08/2011    With drug-eluting stent to right coronary artery and obtuse marginal vessel    Past Surgical History  Procedure Laterality Date  . Cardiac catheterization      Drug-eluting stent to proximal right coronary artery and first acute marginal vessel  . Left heart catheterization with coronary angiogram N/A 08/16/2011    Procedure: LEFT HEART CATHETERIZATION WITH CORONARY ANGIOGRAM;  Surgeon: Peter M Martinique, MD;  Location: [facility] CATH LAB;  Service:  Cardiovascular;  Laterality: N/A;    History   Social History  . Marital Status: Divorced    Spouse Name: N/A    Number of Children: N/A  . Years of Education: N/A   Occupational History  . Not on file.   Social History Main Topics  . Smoking status: Former Smoker    Types: Cigarettes    Quit date: 08/15/1985  . Smokeless tobacco: Not on file  . Alcohol Use: Yes     Comment: little use  . Drug Use: No  . Sexual Activity: Not on file   Other Topics Concern  . Not on file   Social History Narrative   BP 125/68 Pulse 76 SpO2 97% Weight 225 lb (102.059 kg) Height 5\' 9"  (1.753 m)     PHYSICAL EXAM General: NAD HEENT: Normal. Neck: No JVD, no thyromegaly. Lungs: Clear to auscultation bilaterally with normal respiratory effort. CV: Nondisplaced PMI.  Regular rate and rhythm, normal S1/S2, no S3/S4, no murmur. No pretibial or periankle edema.  No carotid bruit.  Normal pedal pulses.  Abdomen: Soft, nontender, no hepatosplenomegaly, no distention.  Neurologic: Alert and oriented x 3.  Psych: Normal affect. Skin: Normal. Musculoskeletal: Normal range of motion, no gross deformities. Extremities: No clubbing or cyanosis.   ECG: Most recent ECG reviewed.      ASSESSMENT AND PLAN: 1. CAD: Symptomatically stable. He takes the aspirin on occasion, as it causes him some abdominal discomfort. Continue metoprolol 50 mg twice daily. He  is on Zetia with markedly elevated LDL. Will start Praluent. Will prescribe SL nitroglycerin prn. 2. Essential HTN: Well controlled on current therapy. No changes. 3. Hyperlipidemia: Intolerant to pravastatin and Crestor in the past, which caused myalgias. Currently on Zetia 10 mg with LDL 199. Will initiate PCSK-9 inhibitors (Praluent), and repeat lipids in 8 weeks.  Dispo: f/u 6 months.  Kate Sable, M.D., F.A.C.C.

## 2014-04-07 ENCOUNTER — Encounter: Payer: Self-pay | Admitting: Cardiovascular Disease

## 2014-04-10 ENCOUNTER — Telehealth: Payer: Self-pay

## 2014-04-10 NOTE — Telephone Encounter (Signed)
Pt eligible to apply for praluent assistance.Form is in drawer at front dek for him to sign so i can the fax all form to company.Pt states he is not sure if he can come today

## 2014-11-25 ENCOUNTER — Encounter: Payer: Self-pay | Admitting: *Deleted

## 2014-11-25 ENCOUNTER — Ambulatory Visit (INDEPENDENT_AMBULATORY_CARE_PROVIDER_SITE_OTHER): Payer: Self-pay | Admitting: Cardiovascular Disease

## 2014-11-25 VITALS — BP 134/82 | HR 75 | Ht 69.0 in | Wt 219.0 lb

## 2014-11-25 DIAGNOSIS — I10 Essential (primary) hypertension: Secondary | ICD-10-CM

## 2014-11-25 DIAGNOSIS — Z136 Encounter for screening for cardiovascular disorders: Secondary | ICD-10-CM

## 2014-11-25 DIAGNOSIS — I2510 Atherosclerotic heart disease of native coronary artery without angina pectoris: Secondary | ICD-10-CM

## 2014-11-25 DIAGNOSIS — E785 Hyperlipidemia, unspecified: Secondary | ICD-10-CM

## 2014-11-25 DIAGNOSIS — Z9114 Patient's other noncompliance with medication regimen: Secondary | ICD-10-CM

## 2014-11-25 NOTE — Progress Notes (Signed)
Patient ID: Manuel Nguyen, male   DOB: Jan 25, 1951, 64 y.o.   MRN: [id number]      SUBJECTIVE: The patient presents for routine follow up. 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  He has been intolerant to both Crestor and pravastatin in the past, as it led to myalgias. Zetia only reduced LDL to 199 previously. I prescribed PCSK-9 inhibitors, but he is not taking it.  Denies chest pain and shortness of breath. Has not had to use SL nitroglycerin.  ECG today shows sinus rhythm with a diffuse nonspecific T wave abnormality.  Lipid panel dated 11/17/14 shows TC 317, TG 176, HDL 41, LDL 241.   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  Review of Systems: As per "subjective", otherwise negative.  No Known Allergies  Current Outpatient Prescriptions  Medication Sig Dispense Refill  . losartan (COZAAR) 50 MG tablet Take 50 mg by mouth daily.    . metoprolol (LOPRESSOR) 50 MG tablet Take 1 tablet (50 mg total) by mouth 2 (two) times daily. 60 tablet 6  . nitroGLYCERIN (NITROSTAT) 0.4 MG SL tablet Place 1 tablet (0.4 mg total) under the tongue every 5 (five) minutes as needed for chest pain. 25 tablet 3   No current facility-administered medications for this visit.    Past Medical History  Diagnosis Date  . Hypertension   . Hyperlipemia   . Coronary artery disease 08/2011    With drug-eluting stent to right coronary artery and obtuse marginal vessel    Past Surgical History  Procedure Laterality Date  . Cardiac catheterization      Drug-eluting stent to proximal right coronary artery and first acute marginal vessel  . Left heart catheterization with coronary angiogram N/A 08/16/2011    Procedure: LEFT HEART CATHETERIZATION  WITH CORONARY ANGIOGRAM;  Surgeon: Peter M Martinique, MD;  Location: [facility] CATH LAB;  Service: Cardiovascular;  Laterality: N/A;    Social History   Social History  . Marital Status: Divorced    Spouse Name: N/A  . Number of Children: N/A  . Years of Education: N/A   Occupational History  . Not on file.   Social History Main Topics  . Smoking status: Former Smoker    Types: Cigarettes    Start date: 12/21/1964    Quit date: 08/15/1985  . Smokeless tobacco: Former Systems developer    Types: [place] date: 11/26/1993  . Alcohol Use: 0.0 oz/week    0 Standard drinks or equivalent per week     Comment: little use  . Drug Use: No  . Sexual Activity: Not on file   Other Topics Concern  . Not on file   Social History Narrative     Filed Vitals:   11/25/14 1048  BP: 134/82  Pulse: 75  Height: 5\' 9"  (1.753 m)  Weight: 219 lb (99.338 kg)  SpO2: 98%    PHYSICAL EXAM General: NAD HEENT: Normal. Neck: No JVD, no thyromegaly. Lungs: Clear to auscultation bilaterally with normal respiratory effort. CV: Nondisplaced PMI.  Regular rate and rhythm, normal S1/S2, no S3/S4, no murmur. No pretibial or periankle edema.   Abdomen: Soft, nontender, obese, no distention.  Neurologic: Alert and oriented x 3.  Psych: Normal affect. Skin:  Normal. Musculoskeletal: Normal range of motion, no gross deformities. Extremities: No clubbing or cyanosis.   ECG: Most recent ECG reviewed.      ASSESSMENT AND PLAN: 1. CAD/ischemic heart disease: Symptomatically stable. He takes aspirin on occasion, as it causes him some abdominal discomfort. Continue metoprolol 50 mg twice daily. I previously started PCSK-9 inhibitors, but he is not taking it. Continue SL nitroglycerin prn. Will initiate Repatha  2. Essential HTN: Well controlled on current therapy. No changes.  3. Dyslipidemia: Intolerant to pravastatin and Crestor in the past, which caused myalgias. Zetia only reduced LDL to 199 in past. I  previously initiated PCSK-9 inhibitors, but he is not taking it. I will start [place].  Dispo: f/u 6 months.   Kate Sable, M.D., F.A.C.C.

## 2014-11-25 NOTE — Patient Instructions (Signed)
Your physician has recommended you make the following change in your medication:  Start repatha. You will be contacted about starting this medication. Continue all other medications the same. Your physician recommends that you schedule a follow-up appointment in:6 months. You will receive a reminder letter in the mail in about months reminding you to call and schedule your appointment. If you don't receive this letter, please contact our office.

## 2014-11-26 MED ORDER — EVOLOCUMAB 140 MG/ML ~~LOC~~ SOSY: 140.0000 mg | PREFILLED_SYRINGE | SUBCUTANEOUS | Status: DC

## 2014-11-26 NOTE — Addendum Note (Signed)
Addended by: Merlene Laughter on: 11/26/2014 08:33 AM   Modules accepted: Orders

## 2014-11-28 ENCOUNTER — Telehealth: Payer: Self-pay | Admitting: *Deleted

## 2014-12-02 NOTE — Telephone Encounter (Signed)
Patient informed to come by the office to sign paperwork for his repatha.

## 2014-12-10 ENCOUNTER — Telehealth: Payer: Self-pay | Admitting: Cardiovascular Disease

## 2014-12-10 NOTE — Telephone Encounter (Signed)
Sarah from [facility] is looking for the in Home injection training signed by the Dr. Jaymes Graff NP

## 2014-12-19 NOTE — Telephone Encounter (Signed)
Manuel Nguyen has called to let us know that they need a nursing order signed by any covering physician since this patient's MD is out of the office.  Secondly, Manuel Nguyen expressed concern that they cannot seem to get Manuel Nguyen on the phone to set up an appointment for demonstrating how to give himself a Repatha shot.

## 2014-12-19 NOTE — Telephone Encounter (Signed)
LM for pt,offering to teach him how to give self Repatha injection or I will offer to give pt injection bi-weekly

## 2014-12-23 NOTE — Telephone Encounter (Signed)
12/19/14 I also mailed letter to patient-cc

## 2015-01-07 ENCOUNTER — Emergency Department (HOSPITAL_COMMUNITY)
Admission: EM | Admit: 2015-01-07 | Discharge: 2015-01-07 | Disposition: A | Discharge status: Home Health | Payer: Medicare Other | Attending: Emergency Medicine | Admitting: Emergency Medicine

## 2015-01-07 ENCOUNTER — Encounter (HOSPITAL_COMMUNITY): Payer: Self-pay | Admitting: Emergency Medicine

## 2015-01-07 DIAGNOSIS — Z8639 Personal history of other endocrine, nutritional and metabolic disease: Secondary | ICD-10-CM | POA: Diagnosis not present

## 2015-01-07 DIAGNOSIS — Y998 Other external cause status: Secondary | ICD-10-CM | POA: Diagnosis not present

## 2015-01-07 DIAGNOSIS — S0101XA Laceration without foreign body of scalp, initial encounter: Secondary | ICD-10-CM | POA: Insufficient documentation

## 2015-01-07 DIAGNOSIS — Z9889 Other specified postprocedural states: Secondary | ICD-10-CM | POA: Diagnosis not present

## 2015-01-07 DIAGNOSIS — I2510 Atherosclerotic heart disease of native coronary artery without angina pectoris: Secondary | ICD-10-CM | POA: Insufficient documentation

## 2015-01-07 DIAGNOSIS — I10 Essential (primary) hypertension: Secondary | ICD-10-CM | POA: Insufficient documentation

## 2015-01-07 DIAGNOSIS — Z87891 Personal history of nicotine dependence: Secondary | ICD-10-CM | POA: Diagnosis not present

## 2015-01-07 DIAGNOSIS — W228XXA Striking against or struck by other objects, initial encounter: Secondary | ICD-10-CM | POA: Diagnosis not present

## 2015-01-07 DIAGNOSIS — Y9289 Other specified places as the place of occurrence of the external cause: Secondary | ICD-10-CM | POA: Diagnosis not present

## 2015-01-07 DIAGNOSIS — S0990XA Unspecified injury of head, initial encounter: Secondary | ICD-10-CM | POA: Diagnosis present

## 2015-01-07 DIAGNOSIS — Y9389 Activity, other specified: Secondary | ICD-10-CM | POA: Diagnosis not present

## 2015-01-07 DIAGNOSIS — Z79899 Other long term (current) drug therapy: Secondary | ICD-10-CM | POA: Insufficient documentation

## 2015-01-07 MED ORDER — BACITRACIN ZINC 500 UNIT/GM EX OINT
TOPICAL_OINTMENT | CUTANEOUS | Status: AC
  Administered 2015-01-07: 1
  Filled 2015-01-07: qty 0.9

## 2015-01-07 MED ORDER — DOUBLE ANTIBIOTIC 500-10000 UNIT/GM EX OINT
TOPICAL_OINTMENT | Freq: Once | CUTANEOUS | Status: DC
  Filled 2015-01-07: qty 1

## 2015-01-07 MED ORDER — IBUPROFEN 800 MG PO TABS: 800.0000 mg | ORAL_TABLET | Freq: Three times a day (TID) | ORAL | Status: DC

## 2015-01-07 NOTE — ED Notes (Signed)
Pt reports he got hit upside the head with a piece of iron by trying to put a spring in line on the truck.  Pt has laceration to parietal are of head.  Pt not on blood thinners, has slight h/a.Pt alert and oriented.

## 2015-01-07 NOTE — ED Provider Notes (Signed)
The patient is a 64 year old male, he was struck in the head with his crowbar while he was working underneath his truck, this was a self injury, accidental, no loss of consciousness, minimal headache, laceration with small amount of bleeding down his face.  He has had TDAP within last 5 years  Lac to the anterior scalp in the hairline - ~ 3 cm  reparied with staples.  Meds given in ED:  Medications  polymixin-bacitracin (POLYSPORIN) ointment (not administered)  bacitracin 500 UNIT/GM ointment (1 application  Given 44/97/53 1647)    Medical screening examination/treatment/procedure(s) were conducted as a shared visit with non-physician practitioner(s) and myself.  I personally evaluated the patient during the encounter.  Clinical Impression:   Final diagnoses:  Laceration of scalp, initial encounter          Noemi Chapel, MD 01/07/15 [phone]

## 2015-01-07 NOTE — ED Provider Notes (Signed)
CSN: [id number]     Arrival date & time 01/07/15  1617 History   First MD Initiated Contact with Patient 01/07/15 1626     Chief Complaint  Patient presents with  . Head Laceration    HPI   Manuel Nguyen is a 64 y.o. male with PMH including CAD, HTN, and HLD who presents to the ED for evaluation of laceration to his forehead. The injury was sustained at ~15:00 today as he was working on his truck and struck his head on a rod. Pt reports the area was bleeding profusely and he immediately applied pressure with towels; states bleeding was controlled within a few minutes. States he did not clean the area out at all. Reports mild headache now. Denies LOC or dizziness. Denies changes in vision or ringing in his ears.  States he does not take blood thinners. Patient reports last Tdap was less than 5 years ago.   Past Medical History  Diagnosis Date  . Hypertension   . Hyperlipemia   . Coronary artery disease 08/2011    With drug-eluting stent to right coronary artery and obtuse marginal vessel   Past Surgical History  Procedure Laterality Date  . Cardiac catheterization      Drug-eluting stent to proximal right coronary artery and first acute marginal vessel  . Left heart catheterization with coronary angiogram N/A 08/16/2011    Procedure: LEFT HEART CATHETERIZATION WITH CORONARY ANGIOGRAM;  Surgeon: Peter M Martinique, MD;  Location: [facility] CATH LAB;  Service: Cardiovascular;  Laterality: N/A;   Family History  Problem Relation Age of Onset  . Diabetes Brother   . Diabetes Sister   . Hypertension Brother   . Hypertension Sister   . Hyperlipidemia Sister   . Hyperlipidemia Brother    Social History  Substance Use Topics  . Smoking status: Former Smoker    Types: Cigarettes    Start date: 12/21/1964    Quit date: 08/15/1985  . Smokeless tobacco: Former Systems developer    Types: [place] date: 11/26/1993  . Alcohol Use: 0.0 oz/week    0 Standard drinks or equivalent per week     Comment: little use     Review of Systems  HENT: Negative for ear pain, hearing loss, nosebleeds and tinnitus.   Eyes: Negative for visual disturbance.  Respiratory: Negative for shortness of breath.   Cardiovascular: Negative for chest pain and palpitations.  Musculoskeletal: Negative for back pain and neck pain.  Neurological: Positive for headaches. Negative for syncope, weakness, light-headedness and numbness.      Allergies  Review of patient's allergies indicates no known allergies.  Home Medications   Prior to Admission medications   Medication Sig Start Date End Date Taking? Authorizing Provider  Evolocumab (REPATHA) 140 MG/ML SOSY Inject 140 mg into the skin every 14 (fourteen) days. 11/26/14   Herminio Commons, MD  ibuprofen (ADVIL,MOTRIN) 800 MG tablet Take 1 tablet (800 mg total) by mouth 3 (three) times daily. 01/07/15   Olivia Canter Samaniego, PA-C  losartan (COZAAR) 50 MG tablet Take 50 mg by mouth daily.    Historical Provider, MD  metoprolol (LOPRESSOR) 50 MG tablet Take 1 tablet (50 mg total) by mouth 2 (two) times daily. 01/30/14   Arnoldo Lenis, MD  nitroGLYCERIN (NITROSTAT) 0.4 MG SL tablet Place 1 tablet (0.4 mg total) under the tongue every 5 (five) minutes as needed for chest pain. 04/04/14   Herminio Commons, MD   BP 157/80 mmHg  Pulse 99  Temp(Src) 98.6 F (37 C) (Oral)  Resp 14  Ht 5\' 9"  (1.753 m)  Wt 218 lb (98.884 kg)  BMI 32.18 kg/m2  SpO2 97% Physical Exam  Constitutional: He is oriented to person, place, and time.  HENT:  Head:    Right Ear: External ear normal.  Left Ear: External ear normal.  Mouth/Throat: Oropharynx is clear and moist. No oropharyngeal exudate.  2cm linear laceration along middle of frontal hairline. Bleeding well controlled. No debris or foreign body.  Eyes: Conjunctivae and EOM are normal. Pupils are equal, round, and reactive to light.  Neck: Normal range of motion.  Cardiovascular: Normal rate, regular rhythm, normal heart sounds  and intact distal pulses.   No murmur heard. Pulmonary/Chest: Effort normal and breath sounds normal. No respiratory distress.  Abdominal: Soft. Bowel sounds are normal. There is no tenderness.  Lymphadenopathy:    He has no cervical adenopathy.  Neurological: He is alert and oriented to person, place, and time.  Skin: Skin is warm and dry.  Nursing note and vitals reviewed.   ED Course  Procedures (including critical care time) LACERATION REPAIR Performed by: Delrae Rend Authorized by: Delrae Rend Consent: Verbal consent obtained. Risks and benefits: risks, benefits and alternatives were discussed Consent given by: patient Patient identity confirmed: provided demographic data  Irrigated with sterile NS  Laceration Location: frontal scalp, mid hairline  Laceration Length: 3 cm  No Foreign Bodies seen or palpated  Anesthesia: none  Irrigation method: syringe Amount of cleaning: standard  Skin closure: staples x 2  Patient tolerance: Patient tolerated the procedure well with no immediate complications.    MDM   Final diagnoses:  Laceration of scalp, initial encounter    3cm laceration to frontal scalp with 2cm section that required closure. No imaging indicated at this time. Irrigated wound with copious NS. Patient declined anesthesia. 2 staples placed with no complications. Bleeding well-controlled. Dressed wound with bacitracin and discharged home with return precautions and rx for ibuprofen. Pt to have staples removed in 1 week.      Manuel Starch, PA-C 01/07/15 1715  Manuel Chapel, MD 01/07/15 [phone]

## 2015-01-07 NOTE — Discharge Instructions (Signed)
You were seen today for repair of a laceration to your forehead. 2 staples were placed to help the area heal. You will need to either see your primary care provider or return to the ER in one week for their removal. Keep the area clean with soap and warm water. Return to the ER if you develop fever, chills, uncontrolled bleeding, or pus from the wound.    Please obtain all of your results from medical records or have your doctors office obtain the results - share them with your doctor - you should be seen at your doctors office in the next 2 days. Call today to arrange your follow up. Take the medications as prescribed. Please review all of the medicines and only take them if you do not have an allergy to them. Please be aware that if you are taking birth control pills, taking other prescriptions, ESPECIALLY ANTIBIOTICS may make the birth control ineffective - if this is the case, either do not engage in sexual activity or use alternative methods of birth control such as condoms until you have finished the medicine and your family doctor says it is OK to restart them. If you are on a blood thinner such as COUMADIN, be aware that any other medicine that you take may cause the coumadin to either work too much, or not enough - you should have your coumadin level rechecked in next 7 days if this is the case.  ?  It is also a possibility that you have an allergic reaction to any of the medicines that you have been prescribed - Everybody reacts differently to medications and while MOST people have no trouble with most medicines, you may have a reaction such as nausea, vomiting, rash, swelling, shortness of breath. If this is the case, please stop taking the medicine immediately and contact your physician.  ?  You should return to the ER if you develop severe or worsening symptoms.

## 2015-02-09 ENCOUNTER — Telehealth: Payer: Self-pay

## 2015-02-09 NOTE — Telephone Encounter (Signed)
Velma is trying to help figure out where her PT's Repatha is. I have let her know that I will work on tracking the answers down, as I have called the foundations that are helping get his medications. I will call Velma back tomorrow once I have more information to give her. She gave me her cell phone number to contact her on. [phone]

## 2015-02-10 ENCOUNTER — Telehealth: Payer: Self-pay

## 2015-02-10 NOTE — Telephone Encounter (Signed)
Have called pt several times trying to find out what information he can give me concerning his repatha. I have left voice messages for him to please return my call.

## 2015-02-11 ENCOUNTER — Telehealth: Payer: Self-pay

## 2015-02-11 NOTE — Telephone Encounter (Signed)
Pt called after hearing the voice messages left by our staff. He did call [employer] and they mailed him an application, which he never filled out nor brought to Dr. Bronson Ing to fill in his part. So, upon PT getting medicare after the first application was submitted, I told him we will start a new ap[plication. He agreed it was fine.

## 2015-02-23 ENCOUNTER — Other Ambulatory Visit: Payer: Self-pay | Admitting: *Deleted

## 2015-02-23 MED ORDER — METOPROLOL TARTRATE 50 MG PO TABS: 50.0000 mg | ORAL_TABLET | Freq: Two times a day (BID) | ORAL | Status: DC

## 2015-02-26 ENCOUNTER — Telehealth: Payer: Self-pay

## 2015-02-26 MED ORDER — METOPROLOL TARTRATE 50 MG PO TABS: 50.0000 mg | ORAL_TABLET | Freq: Two times a day (BID) | ORAL | Status: DC

## 2015-02-26 NOTE — Telephone Encounter (Signed)
Sent in rx.

## 2015-03-10 ENCOUNTER — Telehealth: Payer: Self-pay | Admitting: Cardiovascular Disease

## 2015-03-10 DIAGNOSIS — E785 Hyperlipidemia, unspecified: Secondary | ICD-10-CM

## 2015-03-10 NOTE — Telephone Encounter (Signed)
Need current labs for repatha (lipids)  lmtcb-cc lab orders in computer

## 2015-03-10 NOTE — Telephone Encounter (Signed)
Pt states he never received the assistance to get his Repatha and never got it in the mail.

## 2015-03-12 NOTE — Telephone Encounter (Signed)
Pt called and stated he has labs recent that he will fax to [place]

## 2015-05-18 ENCOUNTER — Telehealth: Payer: Self-pay

## 2015-05-18 NOTE — Telephone Encounter (Signed)
I spoke with [place] at [facility] regarding approval of [place]. Pt was approved on on 04/16/15 but told [place] said patine wanted to talk with [facility] which holds his pharmacy benefits fiirst.they have not hear back from him. I called and left a message for him to call me.

## 2015-05-21 ENCOUNTER — Telehealth: Payer: Self-pay

## 2015-05-21 NOTE — Telephone Encounter (Signed)
Patient will call [place] and and ask for assistance with co-pay

## 2015-05-26 ENCOUNTER — Telehealth: Payer: Self-pay

## 2015-05-26 NOTE — Telephone Encounter (Signed)
Pt has left several call to [place] and per patient ,they have not returned his calls.He cannot afford the $400 monthly cost for Repatha that his insurance approved.I spoke with Elwin Sleight RN, [place] Access Specialist to ask her to follow up on this. She suggests patient call [employer] [phone] Asked by Izora Gala to call [place] at [facility] on 05/27/15 when [facility] has added Repatha to their formulary.

## 2015-06-01 ENCOUNTER — Encounter: Payer: Self-pay | Admitting: Cardiovascular Disease

## 2015-06-01 ENCOUNTER — Ambulatory Visit (INDEPENDENT_AMBULATORY_CARE_PROVIDER_SITE_OTHER): Payer: Medicare HMO | Admitting: Cardiovascular Disease

## 2015-06-01 VITALS — BP 140/80 | HR 79 | Ht 69.0 in | Wt 220.0 lb

## 2015-06-01 DIAGNOSIS — E785 Hyperlipidemia, unspecified: Secondary | ICD-10-CM | POA: Diagnosis not present

## 2015-06-01 DIAGNOSIS — I2510 Atherosclerotic heart disease of native coronary artery without angina pectoris: Secondary | ICD-10-CM | POA: Diagnosis not present

## 2015-06-01 DIAGNOSIS — I10 Essential (primary) hypertension: Secondary | ICD-10-CM | POA: Diagnosis not present

## 2015-06-01 NOTE — Patient Instructions (Signed)
Your physician wants you to follow-up in: 6 months with Dr Koneswaran You will receive a reminder letter in the mail two months in advance. If you don't receive a letter, please call our office to schedule the follow-up appointment.    Your physician recommends that you continue on your current medications as directed. Please refer to the Current Medication list given to you today.      If you need a refill on your cardiac medications before your next appointment, please call your pharmacy.      Thank you for choosing [facility] !        

## 2015-06-01 NOTE — Progress Notes (Signed)
Patient ID: Manuel Nguyen, male   DOB: 16-May-1950, 65 y.o.   MRN: [id number]      SUBJECTIVE: The patient presents for routine follow up. 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  He has been intolerant to both Crestor and pravastatin in the past, as it led to myalgias. Zetia only reduced LDL to 199 previously. I prescribed PCSK-9 inhibitors, but he is not taking it because he said he never received it.  Denies exertional chest pain and shortness of breath. No SL nitro use since his last visit.    Lipids on 05/28/15 showed total cholesterol 297, trig glycerides 93, HDL 44, LDL 234. HbA1c 6.8%.   Review of Systems: As per "subjective", otherwise negative.  No Known Allergies  Current Outpatient Prescriptions  Medication Sig Dispense Refill  . ibuprofen (ADVIL,MOTRIN) 800 MG tablet Take 1 tablet (800 mg total) by mouth 3 (three) times daily. 21 tablet 0  . losartan (COZAAR) 50 MG tablet Take 50 mg by mouth daily.    . metoprolol (LOPRESSOR) 50 MG tablet Take 1 tablet (50 mg total) by mouth 2 (two) times daily. 60 tablet 6  . nitroGLYCERIN (NITROSTAT) 0.4 MG SL tablet Place 1 tablet (0.4 mg total) under the tongue every 5 (five) minutes as needed for chest pain. 25 tablet 3  . Evolocumab (REPATHA) 140 MG/ML SOSY Inject 140 mg into the skin every 14 (fourteen) days. (Patient not taking: Reported on 06/01/2015) 1 mL 11   No current facility-administered medications for this visit.    Past Medical History  Diagnosis Date  . Hypertension   . Hyperlipemia   . Coronary artery disease 08/2011    With drug-eluting stent to right coronary artery and obtuse marginal vessel    Past Surgical History  Procedure Laterality Date  . Cardiac  catheterization      Drug-eluting stent to proximal right coronary artery and first acute marginal vessel  . Left heart catheterization with coronary angiogram N/A 08/16/2011    Procedure: LEFT HEART CATHETERIZATION WITH CORONARY ANGIOGRAM;  Surgeon: Peter M Martinique, MD;  Location: [facility] CATH LAB;  Service: Cardiovascular;  Laterality: N/A;    Social History   Social History  . Marital Status: Divorced    Spouse Name: N/A  . Number of Children: N/A  . Years of Education: N/A   Occupational History  . Not on file.   Social History Main Topics  . Smoking status: Former Smoker    Types: Cigarettes    Start date: 12/21/1964    Quit date: 08/15/1985  . Smokeless tobacco: Former Systems developer    Types: [place] date: 11/26/1993  . Alcohol Use: 0.0 oz/week    0 Standard drinks or equivalent per week     Comment: little use  . Drug Use: No  . Sexual Activity: Not on file   Other Topics Concern  . Not on file   Social History Narrative     Filed Vitals:   06/01/15 0821  BP: 140/80  Pulse: 79  Height: 5\' 9"  (1.753 m)  Weight: 220 lb (99.791 kg)  SpO2: 97%    PHYSICAL EXAM General: NAD HEENT: Normal. Neck: No JVD, no thyromegaly. Lungs: Clear to auscultation bilaterally with normal  respiratory effort. CV: Nondisplaced PMI. Regular rate and rhythm, normal S1/S2, no S3/S4, no murmur. No pretibial or periankle edema.  Abdomen: Soft, nontender, obese, no distention.  Neurologic: Alert and oriented x 3.  Psych: Normal affect. Skin: Normal. Musculoskeletal: No gross deformities.   ECG: Most recent ECG reviewed.      ASSESSMENT AND PLAN: 1. CAD/ischemic heart disease: Symptomatically stable. He takes aspirin on occasion, as it causes him some abdominal discomfort. Continue metoprolol 50 mg twice daily. I previously started PCSK-9 inhibitors, but he is not taking it. Continue SL nitroglycerin prn. He is at high risk for recurrent MI given markedly elevated lipids. Will try  and assist obtaining Repatha as it is now on Medicare formulary.  2. Essential HTN: Borderline elevated. No changes.  3. Dyslipidemia: Intolerant to pravastatin and Crestor in the past, which caused myalgias. Zetia only reduced LDL to 199 in past. I previously initiated PCSK-9 inhibitors, but he is not taking it. Will try and assist obtaining Repatha as it is now on Medicare formulary.  Dispo: f/u 6 months.  Kate Sable, M.D., F.A.C.C.

## 2015-06-24 ENCOUNTER — Telehealth: Payer: Self-pay | Admitting: Cardiovascular Disease

## 2015-06-24 NOTE — Telephone Encounter (Signed)
Pt states that he's been approved for his Repatha, but has yet to receive anything in the mail

## 2015-06-24 NOTE — Telephone Encounter (Signed)
Called pt, no answer- lmtcb- 3/29-lm

## 2015-06-25 NOTE — Telephone Encounter (Signed)
I spoke with  [place] and while pt's [place] does carry Repatha on formulary,pt's co-pay is still several hundred dollars. I called and left message with [place] Specialist , Romeo Apple RN, [phone] to use her expertise to help patient as I have run out of options.

## 2015-06-25 NOTE — Telephone Encounter (Signed)
Patient returned call

## 2015-06-26 NOTE — Telephone Encounter (Signed)
Manuel Nguyen from repatha suggested patient call Repatha [phone] to be screened to see if he can be assisted with co-pay fee.LM for patient on his home phone and asked he call me back when he has spoken to them.

## 2015-12-16 ENCOUNTER — Telehealth: Payer: Self-pay

## 2015-12-16 ENCOUNTER — Encounter: Payer: Self-pay | Admitting: Cardiovascular Disease

## 2015-12-16 ENCOUNTER — Ambulatory Visit (INDEPENDENT_AMBULATORY_CARE_PROVIDER_SITE_OTHER): Payer: Medicare HMO | Admitting: Cardiovascular Disease

## 2015-12-16 VITALS — BP 130/80 | HR 86 | Ht 69.0 in | Wt 222.0 lb

## 2015-12-16 DIAGNOSIS — I472 Ventricular tachycardia: Secondary | ICD-10-CM | POA: Diagnosis not present

## 2015-12-16 DIAGNOSIS — Z91148 Patient's other noncompliance with medication regimen for other reason: Secondary | ICD-10-CM

## 2015-12-16 DIAGNOSIS — I10 Essential (primary) hypertension: Secondary | ICD-10-CM | POA: Diagnosis not present

## 2015-12-16 DIAGNOSIS — I25118 Atherosclerotic heart disease of native coronary artery with other forms of angina pectoris: Secondary | ICD-10-CM

## 2015-12-16 DIAGNOSIS — I4729 Other ventricular tachycardia: Secondary | ICD-10-CM

## 2015-12-16 DIAGNOSIS — Z9114 Patient's other noncompliance with medication regimen: Secondary | ICD-10-CM

## 2015-12-16 DIAGNOSIS — E785 Hyperlipidemia, unspecified: Secondary | ICD-10-CM | POA: Diagnosis not present

## 2015-12-16 NOTE — Telephone Encounter (Signed)
Pt was seen in office today, and stated that he never started his repatha. I got his current phone number, and will keep in touch with him concerning this medication. I called [place] stated she will contact pt.

## 2015-12-16 NOTE — Progress Notes (Signed)
SUBJECTIVE: The patient presents for routine follow up. 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  He has been intolerant to both Crestor and pravastatin in the past, as it led to myalgias. Zetia only reduced LDL to 199 previously.   Lipids on 11/27/15 showed total cholesterol 304, triglycerides 210, HDL 31, LDL 231. I prescribed PCSK-9 inhibitors but he is not taking them and said he never received them.  Has occasional chest pain but denies shortness of breath, palps, and leg swelling.   No SL nitro use since his last visit.    ECG performed in the office today which I personally interpreted demonstrated sinus rhythm with old inferior infarct and a nonspecific T wave abnormality.    Review of Systems: As per "subjective", otherwise negative.  No Known Allergies  Current Outpatient Prescriptions  Medication Sig Dispense Refill  . ibuprofen (ADVIL,MOTRIN) 800 MG tablet Take 1 tablet (800 mg total) by mouth 3 (three) times daily. 21 tablet 0  . losartan (COZAAR) 50 MG tablet Take 50 mg by mouth daily.    . metoprolol (LOPRESSOR) 50 MG tablet Take 1 tablet (50 mg total) by mouth 2 (two) times daily. 60 tablet 6  . nitroGLYCERIN (NITROSTAT) 0.4 MG SL tablet Place 1 tablet (0.4 mg total) under the tongue every 5 (five) minutes as needed for chest pain. 25 tablet 3   No current facility-administered medications for this visit.     Past Medical History:  Diagnosis Date  . Coronary artery disease 08/2011   With drug-eluting stent to right coronary artery and obtuse marginal vessel  . Hyperlipemia   . Hypertension     Past Surgical History:  Procedure Laterality Date  . CARDIAC CATHETERIZATION     Drug-eluting stent to  proximal right coronary artery and first acute marginal vessel  . LEFT HEART CATHETERIZATION WITH CORONARY ANGIOGRAM N/A 08/16/2011   Procedure: LEFT HEART CATHETERIZATION WITH CORONARY ANGIOGRAM;  Surgeon: Peter M Martinique, MD;  Location: [facility] CATH LAB;  Service: Cardiovascular;  Laterality: N/A;    Social History   Social History  . Marital status: Divorced    Spouse name: N/A  . Number of children: N/A  . Years of education: N/A   Occupational History  . Not on file.   Social History Main Topics  . Smoking status: Former Smoker    Types: Cigarettes    Start date: 12/21/1964    Quit date: 08/15/1985  . Smokeless tobacco: Former Systems developer    Types: [place] date: 11/26/1993  . Alcohol use 0.0 oz/week     Comment: little use  . Drug use: No  . Sexual activity: Yes    Partners: Female   Other Topics Concern  . Not on file   Social History Narrative  . No narrative on file     Vitals:   12/16/15 1129  BP: 130/80  Pulse: 86  SpO2: 97%  Weight: 222 lb (100.7 kg)  Height: 5\' 9"  (1.753 m)    PHYSICAL EXAM General: NAD HEENT: Normal. Neck: No JVD, no thyromegaly. Lungs: Clear to auscultation bilaterally with normal respiratory effort. CV: Nondisplaced PMI. Regular rate and rhythm, normal S1/S2, no S3/S4, no murmur. No pretibial or  periankle edema.No carotid bruits. Abdomen: Soft, nontender, obese, no distention.  Neurologic: Alert and oriented x 3.  Psych: Normal affect. Skin: Normal. Musculoskeletal: No gross deformities.    ECG: Most recent ECG reviewed.      ASSESSMENT AND PLAN: 1. CAD/ischemic heart disease: Symptomatically stable. He takes aspirin on occasion, as it causes him some abdominal discomfort. Continue metoprolol 50 mg twice daily. I previously started PCSK-9 inhibitors, but he is not taking it and said he never received it. Will have office staff contact rep for assistance. Continue SL nitroglycerin prn. He is at high risk for recurrent MI  given markedly elevated lipids.   2. Essential HTN: Controlled. No changes.  3. Dyslipidemia: Intolerant to pravastatin and Crestor in the past, which caused myalgias. Zetia only reduced LDL to 199 in past. Lipids on 11/27/15 showed total cholesterol 304, triglycerides 210, HDL 31, LDL 231.  I previously initiated PCSK-9 inhibitors, but he is not taking it and said he never received it. Will have office staff contact rep for assistance.  Dispo: f/u 6 months.   Kate Sable, M.D., F.A.C.C.

## 2015-12-16 NOTE — Patient Instructions (Signed)
Medication Instructions:  Your physician recommends that you continue on your current medications as directed. Please refer to the Current Medication list given to you today.   Labwork: none  Testing/Procedures: none  Follow-Up: Your physician wants you to follow-up in: 6  Months.  You will receive a reminder letter in the mail two months in advance. If you don't receive a letter, please call our office to schedule the follow-up appointment.   Any Other Special Instructions Will Be Listed Below (If Applicable).  I will check on the  Repahta for you.    If you need a refill on your cardiac medications before your next appointment, please call your pharmacy.

## 2016-06-14 ENCOUNTER — Telehealth: Payer: Self-pay | Admitting: *Deleted

## 2016-06-14 ENCOUNTER — Ambulatory Visit (INDEPENDENT_AMBULATORY_CARE_PROVIDER_SITE_OTHER): Payer: Medicare HMO | Admitting: Cardiovascular Disease

## 2016-06-14 ENCOUNTER — Encounter: Payer: Self-pay | Admitting: Cardiovascular Disease

## 2016-06-14 VITALS — BP 130/80 | HR 72 | Ht 69.0 in | Wt 218.0 lb

## 2016-06-14 DIAGNOSIS — I25118 Atherosclerotic heart disease of native coronary artery with other forms of angina pectoris: Secondary | ICD-10-CM | POA: Diagnosis not present

## 2016-06-14 DIAGNOSIS — E782 Mixed hyperlipidemia: Secondary | ICD-10-CM | POA: Diagnosis not present

## 2016-06-14 DIAGNOSIS — Z91148 Patient's other noncompliance with medication regimen for other reason: Secondary | ICD-10-CM

## 2016-06-14 DIAGNOSIS — Z713 Dietary counseling and surveillance: Secondary | ICD-10-CM | POA: Diagnosis not present

## 2016-06-14 DIAGNOSIS — Z9114 Patient's other noncompliance with medication regimen: Secondary | ICD-10-CM

## 2016-06-14 DIAGNOSIS — I10 Essential (primary) hypertension: Secondary | ICD-10-CM

## 2016-06-14 MED ORDER — PITAVASTATIN CALCIUM 2 MG PO TABS: 2.0000 mg | ORAL_TABLET | Freq: Every day | ORAL | 6 refills | Status: DC

## 2016-06-14 MED ORDER — EZETIMIBE 10 MG PO TABS: 10.0000 mg | ORAL_TABLET | Freq: Every day | ORAL | 6 refills | Status: DC

## 2016-06-14 NOTE — Patient Instructions (Signed)
Your physician wants you to follow-up in: 6 months Dr Virgina Jock will receive a reminder letter in the mail two months in advance. If you don't receive a letter, please call our office to schedule the follow-up appointment.  START Livalo  2 mg daily   START Zetia 10 mg daily  Get FASTING lab work :Lipd profile in 3 months (mid June)      Thank you for choosing [facility] !

## 2016-06-14 NOTE — Telephone Encounter (Signed)
Patient authorization good until 03/27/17

## 2016-06-14 NOTE — Progress Notes (Signed)
SUBJECTIVE: The patient presents for routine follow up. 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  He has been intolerant to both Crestor and pravastatin in the past, as it led to myalgias. Zetia only reduced LDL to 199 previously.   Has occasional chest pain but denies shortness of breath, palps, and leg swelling.   No SL nitro use since his last visit.   He eats fried chicken and fried fish on a daily basis.  Lipids 04/04/14: Total cholesterol 310, triglycerides 102, HDL 38, LDL 252.  Said copay for Repatha is $500 monthly.  He says he walks a lot but does not exercise. He takes aspirin on occasion.   Review of Systems: As per "subjective", otherwise negative.  No Known Allergies  Current Outpatient Prescriptions  Medication Sig Dispense Refill  . ibuprofen (ADVIL,MOTRIN) 800 MG tablet Take 1 tablet (800 mg total) by mouth 3 (three) times daily. 21 tablet 0  . losartan (COZAAR) 50 MG tablet Take 50 mg by mouth daily.    . metoprolol (LOPRESSOR) 50 MG tablet Take 1 tablet (50 mg total) by mouth 2 (two) times daily. 60 tablet 6  . nitroGLYCERIN (NITROSTAT) 0.4 MG SL tablet Place 1 tablet (0.4 mg total) under the tongue every 5 (five) minutes as needed for chest pain. 25 tablet 3   No current facility-administered medications for this visit.     Past Medical History:  Diagnosis Date  . Coronary artery disease 08/2011   With drug-eluting stent to right coronary artery and obtuse marginal vessel  . Hyperlipemia   . Hypertension     Past Surgical History:  Procedure Laterality Date  . CARDIAC CATHETERIZATION     Drug-eluting stent to proximal right coronary artery and first acute marginal vessel  . LEFT HEART  CATHETERIZATION WITH CORONARY ANGIOGRAM N/A 08/16/2011   Procedure: LEFT HEART CATHETERIZATION WITH CORONARY ANGIOGRAM;  Surgeon: Peter M Martinique, MD;  Location: [facility] CATH LAB;  Service: Cardiovascular;  Laterality: N/A;    Social History   Social History  . Marital status: Divorced    Spouse name: N/A  . Number of children: N/A  . Years of education: N/A   Occupational History  . Not on file.   Social History Main Topics  . Smoking status: Former Smoker    Types: Cigarettes    Start date: 12/21/1964    Quit date: 08/15/1985  . Smokeless tobacco: Former Systems developer    Types: [place] date: 11/26/1993  . Alcohol use 0.0 oz/week     Comment: little use  . Drug use: No  . Sexual activity: Yes    Partners: Female   Other Topics Concern  . Not on file   Social History Narrative  . No narrative on file     Vitals:   06/14/16 0842  BP: 130/80  Pulse: 72  SpO2: 96%  Weight: 218 lb (98.9 kg)  Height: 5\' 9"  (1.753 m)    PHYSICAL EXAM General: NAD HEENT: Normal. Neck: No JVD, no thyromegaly. Lungs: Clear to auscultation bilaterally with normal respiratory effort. CV: Nondisplaced PMI.  Regular rate and rhythm, normal S1/S2, no S3/S4, no murmur. No pretibial or periankle edema.  No carotid bruit.  Abdomen: Soft, nontender, no distention.  Neurologic: Alert and oriented.  Psych: Normal affect. Skin: Normal. Musculoskeletal: No gross deformities.    ECG: Most recent ECG reviewed.      ASSESSMENT AND PLAN:  1. CAD/ischemic heart disease: Symptomatically stable. He takes aspirin on occasion, as it causes him some abdominal discomfort. Continue metoprolol 50 mg twice daily. I previously started PCSK-9 inhibitors, but he is not taking it and said it is too costly. Will start Livalo 2 mg and Zetia 10 mg. Continue SL nitroglycerin prn. He is at high risk for recurrent MI given markedly elevated lipids.   2. Essential HTN: Controlled. No changes.  3. Dyslipidemia:  Intolerant to pravastatin and Crestor in the past, which caused myalgias. Zetia only reduced LDL to 199 in past. Lipids 04/04/14: Total cholesterol 310, triglycerides 102, HDL 38, LDL 252. Said copay for Repatha is $500 monthly. Will start Livalo 2 mg and Zetia 10 mg. Will repeat lipids in 3 months.  4. Dietary counseling:He eats fried chicken and fried fish on a daily basis. I cautioned against this.  Dispo: f/u 6 months.  Kate Sable, M.D., F.A.C.C.

## 2016-12-19 ENCOUNTER — Encounter: Payer: Self-pay | Admitting: Cardiovascular Disease

## 2016-12-19 ENCOUNTER — Ambulatory Visit (INDEPENDENT_AMBULATORY_CARE_PROVIDER_SITE_OTHER): Payer: Medicare HMO | Admitting: Cardiovascular Disease

## 2016-12-19 VITALS — BP 124/74 | HR 73 | Ht 69.0 in | Wt 221.0 lb

## 2016-12-19 DIAGNOSIS — Z9114 Patient's other noncompliance with medication regimen: Secondary | ICD-10-CM

## 2016-12-19 DIAGNOSIS — Z91148 Patient's other noncompliance with medication regimen for other reason: Secondary | ICD-10-CM

## 2016-12-19 DIAGNOSIS — E782 Mixed hyperlipidemia: Secondary | ICD-10-CM | POA: Diagnosis not present

## 2016-12-19 DIAGNOSIS — Z955 Presence of coronary angioplasty implant and graft: Secondary | ICD-10-CM | POA: Diagnosis not present

## 2016-12-19 DIAGNOSIS — Z713 Dietary counseling and surveillance: Secondary | ICD-10-CM | POA: Diagnosis not present

## 2016-12-19 DIAGNOSIS — I10 Essential (primary) hypertension: Secondary | ICD-10-CM | POA: Diagnosis not present

## 2016-12-19 DIAGNOSIS — I25118 Atherosclerotic heart disease of native coronary artery with other forms of angina pectoris: Secondary | ICD-10-CM

## 2016-12-19 NOTE — Progress Notes (Signed)
SUBJECTIVE: The patient presents for routine follow up. 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  He has been intolerant to both Crestor and pravastatin in the past, as it led to myalgias. Zetia only reduced LDL to 199 previously.   He was not able to afford Repatha in the past.  He does not take aspirin in spite of being encouraged to do so.  He routinely eats fried foods and says "I can't quit eating fried foods because I'm hooked on it".  He has not had a take nitroglycerin since his last visit.  He stopped taking Zetia because he said it made him too hot.  Labs 11/02/16: A1c 7.4%, total cholesterol 286, trig glycerides 138, HDL 37, LDL 221, BUN 11, creatinine 1.42.  ECG performed in the office today which I ordered and personally interpreted demonstrated sinus rhythm with old inferior infarct and diffuse nonspecific T wave abnormalities.     Review of Systems: As per "subjective", otherwise negative.  No Known Allergies  Current Outpatient Prescriptions  Medication Sig Dispense Refill  . ibuprofen (ADVIL,MOTRIN) 800 MG tablet Take 1 tablet (800 mg total) by mouth 3 (three) times daily. 21 tablet 0  . losartan (COZAAR) 50 MG tablet Take 50 mg by mouth daily.    . metoprolol (LOPRESSOR) 50 MG tablet Take 1 tablet (50 mg total) by mouth 2 (two) times daily. 60 tablet 6  . nitroGLYCERIN (NITROSTAT) 0.4 MG SL tablet Place 1 tablet (0.4 mg total) under the tongue every 5 (five) minutes as needed for chest pain. 25 tablet 3  . Pitavastatin Calcium 2 MG TABS Take 1 tablet (2 mg total) by mouth daily. 30 tablet 6   No current facility-administered medications for this visit.     Past Medical History:  Diagnosis Date    . Coronary artery disease 08/2011   With drug-eluting stent to right coronary artery and obtuse marginal vessel  . Hyperlipemia   . Hypertension     Past Surgical History:  Procedure Laterality Date  . CARDIAC CATHETERIZATION     Drug-eluting stent to proximal right coronary artery and first acute marginal vessel  . LEFT HEART CATHETERIZATION WITH CORONARY ANGIOGRAM N/A 08/16/2011   Procedure: LEFT HEART CATHETERIZATION WITH CORONARY ANGIOGRAM;  Surgeon: Peter M Martinique, MD;  Location: [facility] CATH LAB;  Service: Cardiovascular;  Laterality: N/A;    Social History   Social History  . Marital status: Divorced    Spouse name: N/A  . Number of children: N/A  . Years of education: N/A   Occupational History  . Not on file.   Social History Main Topics  . Smoking status: Former Smoker    Types: Cigarettes    Start date: 12/21/1964    Quit date: 08/15/1985  . Smokeless tobacco: Former Systems developer    Types: [place] date: 11/26/1993  . Alcohol use 0.0 oz/week     Comment: little use  . Drug use: No  . Sexual activity: Yes    Partners: Female   Other Topics Concern  . Not on file   Social History Narrative  . No narrative on file     Vitals:   12/19/16 0915  BP: 124/74  Pulse:  73  SpO2: 97%  Weight: 221 lb (100.2 kg)  Height: 5\' 9"  (1.753 m)    Wt Readings from Last 3 Encounters:  12/19/16 221 lb (100.2 kg)  06/14/16 218 lb (98.9 kg)  12/16/15 222 lb (100.7 kg)     PHYSICAL EXAM General: NAD HEENT: Normal. Neck: No JVD, no thyromegaly. Lungs: Clear to auscultation bilaterally with normal respiratory effort. CV: Nondisplaced PMI.  Regular rate and rhythm, normal S1/S2, no S3/S4, no murmur. No pretibial or periankle edema.  No carotid bruit.   Abdomen: Soft, nontender, no distention.  Neurologic: Alert and oriented.  Psych: Normal affect. Skin: Normal. Musculoskeletal: No gross deformities.    ECG: Most recent ECG reviewed.   Labs: Lab Results  Component  Value Date/Time   K 3.6 08/18/2011 06:12 AM   BUN 9 08/18/2011 06:12 AM   CREATININE 1.12 08/18/2011 06:12 AM   ALT 24 08/16/2011 10:18 PM   TSH 0.751 08/16/2011 10:18 PM   HGB 13.4 08/17/2011 04:28 AM     Lipids: Lab Results  Component Value Date/Time   LDLCALC 226 (H) 08/17/2011 09:54 AM   CHOL 300 (H) 08/17/2011 09:54 AM   TRIG 171 (H) 08/17/2011 09:54 AM   HDL 40 08/17/2011 09:54 AM       ASSESSMENT AND PLAN:  1. CAD/ischemic heart disease: Symptomatically stable. He does not take ASA. Continue metoprolol 50 mg twice daily. I previously started PCSK-9 inhibitors, but he is not taking it and said it is too costly. He said insurance wouldn't pay for Livalo and he stopped taking Zetia saying it made him too hot. I encouraged him to take Zetia. I will again try to help him obtain PCSK-9 inhibitors. Continue SL nitroglycerin prn.  He is at high risk for recurrent MI given aspirin noncompliance and markedly elevated LDL (221).  2. Essential HTN: Controlled. No changes.  3. Dyslipidemia: Lipids reviewed above. LDL markedly elevated, 221.  I previously started PCSK-9 inhibitors, but he is not taking it and said it is too costly. He said insurance wouldn't pay for Livalo and he stopped taking Zetia saying it made him too hot. I encouraged him to take Zetia. I will again try to help him obtain PCSK-9 inhibitors.  4. Dietary counseling: He said he cannot stop eating fried foods on a daily basis.   Disposition: Follow up 6 months.   Kate Sable, M.D., F.A.C.C.

## 2016-12-19 NOTE — Patient Instructions (Signed)
Your physician wants you to follow-up in: 6 months with Dr Virgina Jock will receive a reminder letter in the mail two months in advance. If you don't receive a letter, please call our office to schedule the follow-up appointment.    Your physician recommends that you continue on your current medications as directed. Please refer to the Current Medication list given to you today.    If you need a refill on your cardiac medications before your next appointment, please call your pharmacy.     No lab work or testing ordered today      Thank you for choosing [place] !

## 2017-06-17 ENCOUNTER — Other Ambulatory Visit: Payer: Self-pay | Admitting: Cardiovascular Disease

## 2017-06-29 ENCOUNTER — Encounter: Payer: Self-pay | Admitting: Internal Medicine

## 2017-08-03 ENCOUNTER — Encounter: Payer: Self-pay | Admitting: Cardiovascular Disease

## 2017-08-03 ENCOUNTER — Ambulatory Visit: Payer: Medicare HMO | Admitting: Cardiovascular Disease

## 2017-08-03 VITALS — BP 126/68 | HR 81 | Ht 69.0 in | Wt 219.0 lb

## 2017-08-03 DIAGNOSIS — E782 Mixed hyperlipidemia: Secondary | ICD-10-CM

## 2017-08-03 DIAGNOSIS — I10 Essential (primary) hypertension: Secondary | ICD-10-CM

## 2017-08-03 DIAGNOSIS — Z9114 Patient's other noncompliance with medication regimen: Secondary | ICD-10-CM

## 2017-08-03 DIAGNOSIS — Z955 Presence of coronary angioplasty implant and graft: Secondary | ICD-10-CM

## 2017-08-03 DIAGNOSIS — Z713 Dietary counseling and surveillance: Secondary | ICD-10-CM

## 2017-08-03 DIAGNOSIS — I25118 Atherosclerotic heart disease of native coronary artery with other forms of angina pectoris: Secondary | ICD-10-CM

## 2017-08-03 MED ORDER — EVOLOCUMAB 140 MG/ML ~~LOC~~ SOAJ: 140.0000 mg | SUBCUTANEOUS | 11 refills | Status: DC

## 2017-08-03 MED ORDER — ASPIRIN EC 81 MG PO TBEC: 81.0000 mg | DELAYED_RELEASE_TABLET | Freq: Every day | ORAL | 3 refills | Status: DC

## 2017-08-03 NOTE — Progress Notes (Signed)
SUBJECTIVE: The patient presents for routine follow up. 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  He has been intolerant to both Crestor and pravastatin in the past, as it led to myalgias. Zetia only reduced LDL to 199 previously.   He stopped taking it nonetheless.  He was not able to afford Repatha in the past.  When speaking with my staff, it appears he never called the number he was supposed to.  He does not take aspirin in spite of being encouraged to do so.  He said it bothers his stomach.  He is known to be noncompliant with both medications and dietary recommendations.  He has occasional chest tightness but denies having to use nitroglycerin since his last visit with me.  He is cut back on fried food consumption.  I reviewed lipids dated 05/26/2017: Total cholesterol 272, to glycerides 130, HDL 42, LDL 204.     Review of Systems: As per "subjective", otherwise negative.  No Known Allergies  Current Outpatient Medications  Medication Sig Dispense Refill  . ezetimibe (ZETIA) 10 MG tablet TAKE 1 TABLET BY MOUTH ONCE DAILY 30 tablet 6  . metoprolol (LOPRESSOR) 50 MG tablet Take 1 tablet (50 mg total) by mouth 2 (two) times daily. 60 tablet 6  . valsartan (DIOVAN) 160 MG tablet Take 160 mg by mouth daily.    . nitroGLYCERIN (NITROSTAT) 0.4 MG SL tablet Place 1 tablet (0.4 mg total) under the tongue every 5 (five) minutes as needed for chest pain. (Patient not taking: Reported on 08/03/2017) 25 tablet 3   No current facility-administered medications for this visit.     Past Medical History:  Diagnosis Date  . Coronary artery disease 08/2011   With drug-eluting stent to right coronary artery and obtuse marginal  vessel  . Hyperlipemia   . Hypertension     Past Surgical History:  Procedure Laterality Date  . CARDIAC CATHETERIZATION     Drug-eluting stent to proximal right coronary artery and first acute marginal vessel  . LEFT HEART CATHETERIZATION WITH CORONARY ANGIOGRAM N/A 08/16/2011   Procedure: LEFT HEART CATHETERIZATION WITH CORONARY ANGIOGRAM;  Surgeon: Peter M Martinique, MD;  Location: [facility] CATH LAB;  Service: Cardiovascular;  Laterality: N/A;    Social History   Socioeconomic History  . Marital status: Divorced    Spouse name: Not on file  . Number of children: Not on file  . Years of education: Not on file  . Highest education level: Not on file  Occupational History  . Not on file  Social Needs  . Financial resource strain: Not on file  . Food insecurity:    Worry: Not on file    Inability: Not on file  . Transportation needs:    Medical: Not on file    Non-medical: Not on file  Tobacco Use  . Smoking status: Former Smoker    Types: Cigarettes    Start date: 12/21/1964    Last attempt to quit: 08/15/1985    Years since quitting: 31.9  . Smokeless tobacco: Former Systems developer    Types: [place] date: 11/26/1993  Substance and Sexual Activity  . Alcohol use: Yes    Alcohol/week: 0.0 oz  Comment: little use  . Drug use: No  . Sexual activity: Yes    Partners: Female  Lifestyle  . Physical activity:    Days per week: Not on file    Minutes per session: Not on file  . Stress: Not on file  Relationships  . Social connections:    Talks on phone: Not on file    Gets together: Not on file    Attends religious service: Not on file    Active member of club or organization: Not on file    Attends meetings of clubs or organizations: Not on file    Relationship status: Not on file  . Intimate partner violence:    Fear of current or ex partner: Not on file    Emotionally abused: Not on file    Physically abused: Not on file    Forced sexual activity: Not on file  Other Topics  Concern  . Not on file  Social History Narrative  . Not on file     Vitals:   08/03/17 0844  BP: 126/68  Pulse: 81  SpO2: 98%  Weight: 219 lb (99.3 kg)  Height: 5\' 9"  (1.753 m)    Wt Readings from Last 3 Encounters:  08/03/17 219 lb (99.3 kg)  12/19/16 221 lb (100.2 kg)  06/14/16 218 lb (98.9 kg)     PHYSICAL EXAM General: NAD HEENT: Normal. Neck: No JVD, no thyromegaly. Lungs: Clear to auscultation bilaterally with normal respiratory effort. CV: Regular rate and rhythm, normal S1/S2, no S3/S4, no murmur. No pretibial or periankle edema.  No carotid bruit.   Abdomen: Soft, nontender, no distention.  Neurologic: Alert and oriented.  Psych: Normal affect. Skin: Normal. Musculoskeletal: No gross deformities.    ECG: Most recent ECG reviewed.   Labs: Lab Results  Component Value Date/Time   K 3.6 08/18/2011 06:12 AM   BUN 9 08/18/2011 06:12 AM   CREATININE 1.12 08/18/2011 06:12 AM   ALT 24 08/16/2011 10:18 PM   TSH 0.751 08/16/2011 10:18 PM   HGB 13.4 08/17/2011 04:28 AM     Lipids: Lab Results  Component Value Date/Time   LDLCALC 226 (H) 08/17/2011 09:54 AM   CHOL 300 (H) 08/17/2011 09:54 AM   TRIG 171 (H) 08/17/2011 09:54 AM   HDL 40 08/17/2011 09:54 AM       ASSESSMENT AND PLAN:  1. CAD/ischemic heart disease: Symptomatically stable. He does not take ASA because he says it upsets his stomach.   I encouraged him to purchase [place] coated aspirin and take it daily.  Continue metoprolol 50 mg twice daily. I previously started PCSK-9 inhibitors, but he is not taking it and said it is too costly.  Upon speaking with my staff further, it appears he never called the number he was supposed to.  I will make a referral to the lipid clinic. He said insurance wouldn't pay for Livalo. He is at high risk for recurrent MI given aspirin noncompliance and markedly elevated LDL (221).  2. Essential HTN: Controlled. No changes.  3. Dyslipidemia: Lipids reviewed  above. LDL markedly elevated, 204.   I previously started PCSK-9 inhibitors, but he is not taking it and said it is too costly.  Upon speaking with my staff further, it appears he never called the number he was supposed to.  I will make a referral to the lipid clinic. He said insurance wouldn't pay for Livalo.  4. Dietary counseling: He has reportedly cut back on fried food consumption.  Disposition: Follow up 6 months   Kate Sable, M.D., F.A.C.C.

## 2017-08-03 NOTE — Patient Instructions (Addendum)
Your physician wants you to follow-up in: 6 months with Dr Virgina Jock will receive a reminder letter in the mail two months in advance. If you don't receive a letter, please call our office to schedule the follow-up appointment.     START enteric coated Aspirin 81 mg daily    You have been referred to the Lipid Clinic at the [facility].They will call you to meet with the pharmacist    All other medications stay the same       No lab work or tests  ordered today      Thank you for choosing [place] !

## 2017-09-21 ENCOUNTER — Ambulatory Visit: Payer: Medicare HMO | Admitting: Nurse Practitioner

## 2017-12-14 ENCOUNTER — Telehealth: Payer: Self-pay

## 2017-12-14 ENCOUNTER — Encounter: Payer: Self-pay | Admitting: Nurse Practitioner

## 2017-12-14 ENCOUNTER — Ambulatory Visit: Payer: Medicare HMO | Admitting: Nurse Practitioner

## 2017-12-14 VITALS — BP 130/80 | HR 70 | Temp 97.0°F | Ht 69.0 in | Wt 221.4 lb

## 2017-12-14 DIAGNOSIS — Z1211 Encounter for screening for malignant neoplasm of colon: Secondary | ICD-10-CM | POA: Insufficient documentation

## 2017-12-14 DIAGNOSIS — I2129 ST elevation (STEMI) myocardial infarction involving other sites: Secondary | ICD-10-CM

## 2017-12-14 NOTE — Telephone Encounter (Signed)
Dr. Jacinta Shoe,   This mutual patient was seen in our office today by Walden Field, NP.  Randall Hiss would like to schedule him for a colonoscopy in the near future with Dr. Gala Romney.  He has never had a colonoscopy.  Please advise if he can be cleared from cardiology for the procedure.  Thanks so much!  Everardo All, LPN

## 2017-12-14 NOTE — Progress Notes (Signed)
Primary Care Physician:  Barry Dienes, NP Primary Gastroenterologist:  Dr. Gala Romney  Chief Complaint  Patient presents with  . Consult    TCS.     HPI:   Manuel Nguyen is a 67 y.o. male who presents referral from primary care to schedule colonoscopy.  Nurse/phone triage was deferred to office visit due to "high risk for MI."  Reviewed information provided with the referral including office note dated 05/26/2017 which indicated the patient had never had a colonoscopy and was referred to GI due to high risk of MI..  The patient was last seen by cardiology 08/03/2017 for coronary artery disease and noncompliance, status post coronary stent placement.  Time noted inferolateral MI status post stenting.  Intolerant to multiple statins, Zetia was tolerated but only reduced LDL to 199 however he stopped taking it nonetheless.  He did not follow through with patient assistance to obtain alternative medications.  Does not take aspirin despite recommendations.  Known to be noncompliant with dietary recommendations and medications.  Occasional chest tightness.  Overall deemed high risk for recurrent MI given markedly elevated LDL at 221 and noncompliance with aspirin and other recommendations.  Today he states he's doing ok overall. Never had a colonoscopy before. Denies abdominal pain, N/V, hematochezia, melena, fever, chills, unintentional weight loss. His chest pain is intermittent but "not that bad" but doesn't keep ntg around. Admits previous MI and stenting. Denies dyspnea, dizziness, lightheadedness, syncope, near syncope. Denies any other upper or lower GI symptoms.  Doesn't take ASA regularly as recommended. Does nott ake Repatha as recommended.  Past Medical History:  Diagnosis Date  . Coronary artery disease 08/2011   With drug-eluting stent to right coronary artery and obtuse marginal vessel  . Hyperlipemia   . Hypertension     Past Surgical History:  Procedure Laterality Date  . CARDIAC  CATHETERIZATION     Drug-eluting stent to proximal right coronary artery and first acute marginal vessel  . LEFT HEART CATHETERIZATION WITH CORONARY ANGIOGRAM N/A 08/16/2011   Procedure: LEFT HEART CATHETERIZATION WITH CORONARY ANGIOGRAM;  Surgeon: Peter M Martinique, MD;  Location: [facility] CATH LAB;  Service: Cardiovascular;  Laterality: N/A;    Current Outpatient Medications  Medication Sig Dispense Refill  . aspirin EC 81 MG tablet Take 1 tablet (81 mg total) by mouth daily. (Patient taking differently: Take 81 mg by mouth as needed. ) 90 tablet 3  . ezetimibe (ZETIA) 10 MG tablet TAKE 1 TABLET BY MOUTH ONCE DAILY 30 tablet 6  . metoprolol (LOPRESSOR) 50 MG tablet Take 1 tablet (50 mg total) by mouth 2 (two) times daily. 60 tablet 6  . valsartan (DIOVAN) 160 MG tablet Take 160 mg by mouth daily.     No current facility-administered medications for this visit.     Allergies as of 12/14/2017  . (No Known Allergies)    Family History  Problem Relation Age of Onset  . Diabetes Brother   . Diabetes Sister   . Hypertension Brother   . Hypertension Sister   . Hyperlipidemia Sister   . Hyperlipidemia Brother   . Colon cancer Neg Hx     Social History   Socioeconomic History  . Marital status: Divorced    Spouse name: Not on file  . Number of children: Not on file  . Years of education: Not on file  . Highest education level: Not on file  Occupational History  . Not on file  Social Needs  . Financial resource strain:  Not on file  . Food insecurity:    Worry: Not on file    Inability: Not on file  . Transportation needs:    Medical: Not on file    Non-medical: Not on file  Tobacco Use  . Smoking status: Current Some Day Smoker    Types: Cigarettes, Cigars    Start date: 12/21/1964    Last attempt to quit: 08/15/1985    Years since quitting: 32.3  . Smokeless tobacco: Former Systems developer    Types: [place] date: 11/26/1993  . Tobacco comment: Cigar once a week (as of 12/14/17)    Substance and Sexual Activity  . Alcohol use: Yes    Alcohol/week: 0.0 standard drinks    Comment: Small drink once a week (weekend) as of 12/14/17  . Drug use: No  . Sexual activity: Yes    Partners: Female  Lifestyle  . Physical activity:    Days per week: Not on file    Minutes per session: Not on file  . Stress: Not on file  Relationships  . Social connections:    Talks on phone: Not on file    Gets together: Not on file    Attends religious service: Not on file    Active member of club or organization: Not on file    Attends meetings of clubs or organizations: Not on file    Relationship status: Not on file  . Intimate partner violence:    Fear of current or ex partner: Not on file    Emotionally abused: Not on file    Physically abused: Not on file    Forced sexual activity: Not on file  Other Topics Concern  . Not on file  Social History Narrative  . Not on file    Review of Systems: General: Negative for anorexia, weight loss, fever, chills, fatigue, weakness. ENT: Negative for hoarseness, difficulty swallowing. CV: Negative for chest pain, angina, palpitations, peripheral edema.  Respiratory: Negative for dyspnea at rest, cough, sputum, wheezing.  GI: See history of present illness. MS: Negative for joint pain, low back pain.  Derm: Negative for rash or itching.  Endo: Negative for unusual weight change.  Heme: Negative for bruising or bleeding. Allergy: Negative for rash or hives.    Physical Exam: BP 130/80   Pulse 70   Temp (!) 97 F (36.1 C) (Oral)   Ht 5\' 9"  (1.753 m)   Wt 221 lb 6.4 oz (100.4 kg)   BMI 32.70 kg/m  General:   Alert and oriented. Pleasant and cooperative. Well-nourished and well-developed.  Head:  Normocephalic and atraumatic. Eyes:  Without icterus, sclera clear and conjunctiva pink.  Ears:  Normal auditory acuity. Cardiovascular:  S1, S2 present without murmurs appreciated. Extremities without clubbing or edema. Respiratory:   Clear to auscultation bilaterally. No wheezes, rales, or rhonchi. No distress.  Gastrointestinal:  +BS, soft, non-tender and non-distended. No HSM noted. No guarding or rebound. No masses appreciated.  Rectal:  Deferred  Musculoskalatal:  Symmetrical without gross deformities. Normal posture. Neurologic:  Alert and oriented x4;  grossly normal neurologically. Psych:  Alert and cooperative. Normal mood and affect. Heme/Lymph/Immune: No excessive bruising noted.    12/14/2017 8:27 AM   Disclaimer: This note was dictated with voice recognition software. Similar sounding words can inadvertently be transcribed and may not be corrected upon review.

## 2017-12-14 NOTE — Assessment & Plan Note (Signed)
Previous MI with drug-eluting stent.  Not following dietary or medication recommendations.  He remains at high risk for MI which could be a roadblock to obtain a colonoscopy.  We will discuss with cardiology further.

## 2017-12-14 NOTE — Progress Notes (Signed)
cc'd to pcp 

## 2017-12-14 NOTE — Assessment & Plan Note (Signed)
The patient is never had a colonoscopy before and he is currently due as he is 67 years old.  He was brought into office visit due to deemed high risk for MI by cardiology.  He is generally noncompliant with cardiology recommendations.  Status post STEMI in 2013 with drug-eluting stent.  He is not following dietary recommendations, not taking medications and subsequently remains high risk.  I will need to discuss with cardiology and with the anticipated endoscopist about these risks to decide if we can proceed with a colonoscopy.  Alternatively, we could plan for Cologuard as a last resort if he is deemed too high risk.  If he does have a colonoscopy it will likely need to be on monitored anesthesia care due to his risks.  We will call with recommendations.

## 2017-12-14 NOTE — Patient Instructions (Signed)
1. I will discuss your care with your cardiologist and with Dr. Gala Romney (the gastroenterologist who would do your colonoscopy). 2. We will hold a slot tentatively for your colonoscopy. 3. After discussing with your other providers we will make recommendations about whether we can proceed with a colonoscopy given your risks. 4. We will call you with these recommendations. 5. Call us if you have any questions or concerns.  At [facility] we value your feedback. You may receive a survey about your visit today. Please share your experience as we strive to create trusting relationships with our patients to provide genuine, compassionate, quality care.  We appreciate your understanding and patience as we review any laboratory studies, imaging, and other diagnostic tests that are ordered as we care for you. Our office policy is 5 business days for review of these results, and any emergent or urgent results are addressed in a timely manner for your best interest. If you do not hear from our office in 1 week, please contact us.   We also encourage the use of MyChart, which contains your medical information for your review as well. If you are not enrolled in this feature, an access code is on this after visit summary for your convenience. Thank you for allowing [place] to be involved in your care.  It was great to meet you today!  I hope you have a great Fall!!

## 2017-12-18 ENCOUNTER — Other Ambulatory Visit: Payer: Self-pay | Admitting: *Deleted

## 2017-12-18 DIAGNOSIS — Z1211 Encounter for screening for malignant neoplasm of colon: Secondary | ICD-10-CM

## 2017-12-18 MED ORDER — PEG 3350-KCL-NA BICARB-NACL 420 G PO SOLR: 4000.0000 mL | Freq: Once | ORAL | 0 refills | Status: AC

## 2017-12-18 NOTE — Telephone Encounter (Signed)
lmovm for pt. 

## 2017-12-18 NOTE — Telephone Encounter (Signed)
Spoke with patient and he is scheduled for 01/18/18 at 10:15am. Rx for prep sent to [facility]. Prep instructions have been mailed.

## 2017-12-18 NOTE — Addendum Note (Signed)
Addended by: Inge Rise on: 12/18/2017 10:55 AM   Modules accepted: Orders

## 2017-12-18 NOTE — Telephone Encounter (Signed)
Pre-op scheduled for 01/11/18 at 11:00am. Letter mailed. LMOVM.

## 2017-12-18 NOTE — Telephone Encounter (Signed)
That would be fine 

## 2017-12-18 NOTE — Telephone Encounter (Signed)
Forwarding to Walden Field, NP and [facility].

## 2017-12-19 NOTE — Telephone Encounter (Signed)
Noted  

## 2017-12-25 ENCOUNTER — Telehealth: Payer: Self-pay | Admitting: Internal Medicine

## 2017-12-25 NOTE — Telephone Encounter (Signed)
According to pt's chart, he doesn't take any diabetic medications so boarderline diabetic will not affect his upcoming procedure. Tried to call pt, no answer, LMOAM and informed him.

## 2017-12-25 NOTE — Telephone Encounter (Signed)
[phone]  Please call patient, he said he was boarderline diabetic and did not know if this would mess with his procedure

## 2018-01-08 NOTE — Patient Instructions (Signed)
Manuel Nguyen  01/08/2018     @PREFPERIOPPHARMACY @   Your procedure is scheduled on  01/18/2018.  Report to [facility] at  830  A.M.  Call this number if you have problems the morning of surgery:  [phone]   Remember:  Do not eat or drink after midnight. Follow the diet and prep instructions given to you by Dr Roseanne Kaufman office.                          Take these medicines the morning of surgery with A SIP OF WATER  Diovan, metoprolol.    Do not wear jewelry, make-up or nail polish.  Do not wear lotions, powders, or perfumes, or deodorant.  Do not shave 48 hours prior to surgery.  Men may shave face and neck.  Do not bring valuables to the hospital.  [facility] is not responsible for any belongings or valuables.  Contacts, dentures or bridgework may not be worn into surgery.  Leave your suitcase in the car.  After surgery it may be brought to your room.  For patients admitted to the hospital, discharge time will be determined by your treatment team.  Patients discharged the day of surgery will not be allowed to drive home.   Name and phone number of your driver:   family Special instructions: None  Please read over the following fact sheets that you were given. Anesthesia Post-op Instructions and Care and Recovery After Surgery       Colonoscopy, Adult A colonoscopy is an exam to look at the large intestine. It is done to check for problems, such as:  Lumps (tumors).  Growths (polyps).  Swelling (inflammation).  Bleeding.  What happens before the procedure? Eating and drinking Follow instructions from your doctor about eating and drinking. These instructions may include:  A few days before the procedure - follow a low-fiber diet. ? Avoid nuts. ? Avoid seeds. ? Avoid dried fruit. ? Avoid raw fruits. ? Avoid vegetables.  1-3 days before the procedure - follow a clear liquid diet. Avoid liquids that have red or purple dye. Drink only  clear liquids, such as: ? Clear broth or bouillon. ? Black coffee or tea. ? Clear juice. ? Clear soft drinks or sports drinks. ? Gelatin dessert. ? Popsicles.  On the day of the procedure - do not eat or drink anything during the 2 hours before the procedure.  Bowel prep If you were prescribed an oral bowel prep:  Take it as told by your doctor. Starting the day before your procedure, you will need to drink a lot of liquid. The liquid will cause you to poop (have bowel movements) until your poop is almost clear or light green.  If your skin or butt gets irritated from diarrhea, you may: ? Wipe the area with wipes that have medicine in them, such as adult wet wipes with aloe and vitamin E. ? Put something on your skin that soothes the area, such as petroleum jelly.  If you throw up (vomit) while drinking the bowel prep, take a break for up to 60 minutes. Then begin the bowel prep again. If you keep throwing up and you cannot take the bowel prep without throwing up, call your doctor.  General instructions  Ask your doctor about changing or stopping your normal medicines. This is important if you take diabetes medicines or blood thinners.  Plan  to have someone take you home from the hospital or clinic. What happens during the procedure?  An IV tube may be put into one of your veins.  You will be given medicine to help you relax (sedative).  To reduce your risk of infection: ? Your doctors will wash their hands. ? Your anal area will be washed with soap.  You will be asked to lie on your side with your knees bent.  Your doctor will get a long, thin, flexible tube ready. The tube will have a camera and a light on the end.  The tube will be put into your anus.  The tube will be gently put into your large intestine.  Air will be delivered into your large intestine to keep it open. You may feel some pressure or cramping.  The camera will be used to take photos.  A small tissue  sample may be removed from your body to be looked at under a microscope (biopsy). If any possible problems are found, the tissue will be sent to a lab for testing.  If small growths are found, your doctor may remove them and have them checked for cancer.  The tube that was put into your anus will be slowly removed. The procedure may vary among doctors and hospitals. What happens after the procedure?  Your doctor will check on you often until the medicines you were given have worn off.  Do not drive for 24 hours after the procedure.  You may have a small amount of blood in your poop.  You may pass gas.  You may have mild cramps or bloating in your belly (abdomen).  It is up to you to get the results of your procedure. Ask your doctor, or the department performing the procedure, when your results will be ready. This information is not intended to replace advice given to you by your health care provider. Make sure you discuss any questions you have with your health care provider. Document Released: 04/16/2010 Document Revised: 01/13/2016 Document Reviewed: 05/26/2015 Elsevier Interactive Patient Education  2017 [employer]  Colonoscopy, Adult, Care After This sheet gives you information about how to care for yourself after your procedure. Your health care provider may also give you more specific instructions. If you have problems or questions, contact your health care provider. What can I expect after the procedure? After the procedure, it is common to have:  A small amount of blood in your stool for 24 hours after the procedure.  Some gas.  Mild abdominal cramping or bloating.  Follow these instructions at home: General instructions   For the first 24 hours after the procedure: ? Do not drive or use machinery. ? Do not sign important documents. ? Do not drink alcohol. ? Do your regular daily activities at a slower pace than normal. ? Eat soft, easy-to-digest foods. ? Rest  often.  Take over-the-counter or prescription medicines only as told by your health care provider.  It is up to you to get the results of your procedure. Ask your health care provider, or the department performing the procedure, when your results will be ready. Relieving cramping and bloating  Try walking around when you have cramps or feel bloated.  Apply heat to your abdomen as told by your health care provider. Use a heat source that your health care provider recommends, such as a moist heat pack or a heating pad. ? Place a towel between your skin and the heat source. ? Leave the  heat on for 20-30 minutes. ? Remove the heat if your skin turns bright red. This is especially important if you are unable to feel pain, heat, or cold. You may have a greater risk of getting burned. Eating and drinking  Drink enough fluid to keep your urine clear or pale yellow.  Resume your normal diet as instructed by your health care provider. Avoid heavy or fried foods that are hard to digest.  Avoid drinking alcohol for as long as instructed by your health care provider. Contact a health care provider if:  You have blood in your stool 2-3 days after the procedure. Get help right away if:  You have more than a small spotting of blood in your stool.  You pass large blood clots in your stool.  Your abdomen is swollen.  You have nausea or vomiting.  You have a fever.  You have increasing abdominal pain that is not relieved with medicine. This information is not intended to replace advice given to you by your health care provider. Make sure you discuss any questions you have with your health care provider. Document Released: 10/27/2003 Document Revised: 12/07/2015 Document Reviewed: 05/26/2015 Elsevier Interactive Patient Education  2018 [place] Anesthesia is a term that refers to techniques, procedures, and medicines that help a person stay safe and comfortable  during a medical procedure. Monitored anesthesia care, or sedation, is one type of anesthesia. Your anesthesia specialist may recommend sedation if you will be having a procedure that does not require you to be unconscious, such as:  Cataract surgery.  A dental procedure.  A biopsy.  A colonoscopy.  During the procedure, you may receive a medicine to help you relax (sedative). There are three levels of sedation:  Mild sedation. At this level, you may feel awake and relaxed. You will be able to follow directions.  Moderate sedation. At this level, you will be sleepy. You may not remember the procedure.  Deep sedation. At this level, you will be asleep. You will not remember the procedure.  The more medicine you are given, the deeper your level of sedation will be. Depending on how you respond to the procedure, the anesthesia specialist may change your level of sedation or the type of anesthesia to fit your needs. An anesthesia specialist will monitor you closely during the procedure. Let your health care provider know about:  Any allergies you have.  All medicines you are taking, including vitamins, herbs, eye drops, creams, and over-the-counter medicines.  Any use of steroids (by mouth or as a cream).  Any problems you or family members have had with sedatives and anesthetic medicines.  Any blood disorders you have.  Any surgeries you have had.  Any medical conditions you have, such as sleep apnea.  Whether you are pregnant or may be pregnant.  Any use of cigarettes, alcohol, or street drugs. What are the risks? Generally, this is a safe procedure. However, problems may occur, including:  Getting too much medicine (oversedation).  Nausea.  Allergic reaction to medicines.  Trouble breathing. If this happens, a breathing tube may be used to help with breathing. It will be removed when you are awake and breathing on your own.  Heart trouble.  Lung trouble.  Before  the procedure Staying hydrated Follow instructions from your health care provider about hydration, which may include:  Up to 2 hours before the procedure - you may continue to drink clear liquids, such as water, clear fruit juice,  black coffee, and plain tea.  Eating and drinking restrictions Follow instructions from your health care provider about eating and drinking, which may include:  8 hours before the procedure - stop eating heavy meals or foods such as meat, fried foods, or fatty foods.  6 hours before the procedure - stop eating light meals or foods, such as toast or cereal.  6 hours before the procedure - stop drinking milk or drinks that contain milk.  2 hours before the procedure - stop drinking clear liquids.  Medicines Ask your health care provider about:  Changing or stopping your regular medicines. This is especially important if you are taking diabetes medicines or blood thinners.  Taking medicines such as aspirin and ibuprofen. These medicines can thin your blood. Do not take these medicines before your procedure if your health care provider instructs you not to.  Tests and exams  You will have a physical exam.  You may have blood tests done to show: ? How well your kidneys and liver are working. ? How well your blood can clot.  General instructions  Plan to have someone take you home from the hospital or clinic.  If you will be going home right after the procedure, plan to have someone with you for 24 hours.  What happens during the procedure?  Your blood pressure, heart rate, breathing, level of pain and overall condition will be monitored.  An IV tube will be inserted into one of your veins.  Your anesthesia specialist will give you medicines as needed to keep you comfortable during the procedure. This may mean changing the level of sedation.  The procedure will be performed. After the procedure  Your blood pressure, heart rate, breathing rate, and  blood oxygen level will be monitored until the medicines you were given have worn off.  Do not drive for 24 hours if you received a sedative.  You may: ? Feel sleepy, clumsy, or nauseous. ? Feel forgetful about what happened after the procedure. ? Have a sore throat if you had a breathing tube during the procedure. ? Vomit. This information is not intended to replace advice given to you by your health care provider. Make sure you discuss any questions you have with your health care provider. Document Released: 12/08/2004 Document Revised: 08/21/2015 Document Reviewed: 07/05/2015 Elsevier Interactive Patient Education  2018 [place], Care After These instructions provide you with information about caring for yourself after your procedure. Your health care provider may also give you more specific instructions. Your treatment has been planned according to current medical practices, but problems sometimes occur. Call your health care provider if you have any problems or questions after your procedure. What can I expect after the procedure? After your procedure, it is common to:  Feel sleepy for several hours.  Feel clumsy and have poor balance for several hours.  Feel forgetful about what happened after the procedure.  Have poor judgment for several hours.  Feel nauseous or vomit.  Have a sore throat if you had a breathing tube during the procedure.  Follow these instructions at home: For at least 24 hours after the procedure:   Do not: ? Participate in activities in which you could fall or become injured. ? Drive. ? Use heavy machinery. ? Drink alcohol. ? Take sleeping pills or medicines that cause drowsiness. ? Make important decisions or sign legal documents. ? Take care of children on your own.  Rest. Eating and drinking  Follow the  diet that is recommended by your health care provider.  If you vomit, drink water, juice, or soup when you  can drink without vomiting.  Make sure you have little or no nausea before eating solid foods. General instructions  Have a responsible adult stay with you until you are awake and alert.  Take over-the-counter and prescription medicines only as told by your health care provider.  If you smoke, do not smoke without supervision.  Keep all follow-up visits as told by your health care provider. This is important. Contact a health care provider if:  You keep feeling nauseous or you keep vomiting.  You feel light-headed.  You develop a rash.  You have a fever. Get help right away if:  You have trouble breathing. This information is not intended to replace advice given to you by your health care provider. Make sure you discuss any questions you have with your health care provider. Document Released: 07/05/2015 Document Revised: 11/04/2015 Document Reviewed: 07/05/2015 Elsevier Interactive Patient Education  [employer].

## 2018-01-11 ENCOUNTER — Encounter (HOSPITAL_COMMUNITY)
Admission: RE | Admit: 2018-01-11 | Discharge: 2018-01-11 | Disposition: A | Discharge status: Home / Self Care | Payer: Medicare HMO | Source: Ambulatory Visit | Attending: Internal Medicine | Admitting: Internal Medicine

## 2018-01-18 ENCOUNTER — Ambulatory Visit (HOSPITAL_COMMUNITY): Payer: Medicare HMO | Admitting: Anesthesiology

## 2018-01-18 ENCOUNTER — Other Ambulatory Visit: Payer: Self-pay

## 2018-01-18 ENCOUNTER — Encounter (HOSPITAL_COMMUNITY)
Admission: RE | Disposition: A | Discharge status: Home / Self Care | Payer: Self-pay | Source: Ambulatory Visit | Attending: Internal Medicine

## 2018-01-18 ENCOUNTER — Encounter (HOSPITAL_COMMUNITY): Payer: Self-pay | Admitting: *Deleted

## 2018-01-18 ENCOUNTER — Ambulatory Visit (HOSPITAL_COMMUNITY)
Admission: RE | Admit: 2018-01-18 | Discharge: 2018-01-18 | Disposition: A | Discharge status: Home / Self Care | Payer: Medicare HMO | Source: Ambulatory Visit | Attending: Internal Medicine | Admitting: Internal Medicine

## 2018-01-18 DIAGNOSIS — D124 Benign neoplasm of descending colon: Secondary | ICD-10-CM | POA: Diagnosis not present

## 2018-01-18 DIAGNOSIS — D125 Benign neoplasm of sigmoid colon: Secondary | ICD-10-CM | POA: Insufficient documentation

## 2018-01-18 DIAGNOSIS — Z955 Presence of coronary angioplasty implant and graft: Secondary | ICD-10-CM | POA: Diagnosis not present

## 2018-01-18 DIAGNOSIS — E785 Hyperlipidemia, unspecified: Secondary | ICD-10-CM | POA: Diagnosis not present

## 2018-01-18 DIAGNOSIS — I10 Essential (primary) hypertension: Secondary | ICD-10-CM | POA: Diagnosis not present

## 2018-01-18 DIAGNOSIS — Z833 Family history of diabetes mellitus: Secondary | ICD-10-CM | POA: Insufficient documentation

## 2018-01-18 DIAGNOSIS — I2510 Atherosclerotic heart disease of native coronary artery without angina pectoris: Secondary | ICD-10-CM | POA: Diagnosis not present

## 2018-01-18 DIAGNOSIS — Z8249 Family history of ischemic heart disease and other diseases of the circulatory system: Secondary | ICD-10-CM | POA: Insufficient documentation

## 2018-01-18 DIAGNOSIS — Z1211 Encounter for screening for malignant neoplasm of colon: Secondary | ICD-10-CM | POA: Insufficient documentation

## 2018-01-18 DIAGNOSIS — Z7982 Long term (current) use of aspirin: Secondary | ICD-10-CM | POA: Insufficient documentation

## 2018-01-18 DIAGNOSIS — Z79899 Other long term (current) drug therapy: Secondary | ICD-10-CM | POA: Insufficient documentation

## 2018-01-18 DIAGNOSIS — D123 Benign neoplasm of transverse colon: Secondary | ICD-10-CM | POA: Insufficient documentation

## 2018-01-18 DIAGNOSIS — Z7984 Long term (current) use of oral hypoglycemic drugs: Secondary | ICD-10-CM | POA: Diagnosis not present

## 2018-01-18 DIAGNOSIS — D120 Benign neoplasm of cecum: Secondary | ICD-10-CM | POA: Insufficient documentation

## 2018-01-18 DIAGNOSIS — F17210 Nicotine dependence, cigarettes, uncomplicated: Secondary | ICD-10-CM | POA: Insufficient documentation

## 2018-01-18 DIAGNOSIS — E119 Type 2 diabetes mellitus without complications: Secondary | ICD-10-CM | POA: Diagnosis not present

## 2018-01-18 DIAGNOSIS — I252 Old myocardial infarction: Secondary | ICD-10-CM | POA: Diagnosis not present

## 2018-01-18 HISTORY — PX: POLYPECTOMY: SHX5525

## 2018-01-18 HISTORY — PX: COLONOSCOPY WITH PROPOFOL: SHX5780

## 2018-01-18 LAB — GLUCOSE, CAPILLARY: Glucose-Capillary: 104 mg/dL — ABNORMAL HIGH (ref 70–99)

## 2018-01-18 SURGERY — COLONOSCOPY WITH PROPOFOL
Anesthesia: General

## 2018-01-18 MED ORDER — HYDROCODONE-ACETAMINOPHEN 7.5-325 MG PO TABS: 1.0000 | ORAL_TABLET | Freq: Once | ORAL | Status: DC | PRN

## 2018-01-18 MED ORDER — PROPOFOL 10 MG/ML IV BOLUS
INTRAVENOUS | Status: DC | PRN
  Administered 2018-01-18: 30 mg via INTRAVENOUS
  Administered 2018-01-18: 20 mg via INTRAVENOUS

## 2018-01-18 MED ORDER — MIDAZOLAM HCL 2 MG/2ML IJ SOLN: 0.5000 mg | Freq: Once | INTRAMUSCULAR | Status: DC | PRN

## 2018-01-18 MED ORDER — PROPOFOL 10 MG/ML IV BOLUS: INTRAVENOUS | Status: DC | PRN

## 2018-01-18 MED ORDER — PROPOFOL 500 MG/50ML IV EMUL
INTRAVENOUS | Status: DC | PRN
  Administered 2018-01-18: 150 ug/kg/min via INTRAVENOUS

## 2018-01-18 MED ORDER — HYDROMORPHONE HCL 1 MG/ML IJ SOLN: 0.2500 mg | INTRAMUSCULAR | Status: DC | PRN

## 2018-01-18 MED ORDER — LACTATED RINGERS IV SOLN
INTRAVENOUS | Status: DC
  Administered 2018-01-18: 09:00:00 via INTRAVENOUS

## 2018-01-18 MED ORDER — PROMETHAZINE HCL 25 MG/ML IJ SOLN: 6.2500 mg | INTRAMUSCULAR | Status: DC | PRN

## 2018-01-18 NOTE — Transfer of Care (Signed)
Immediate Anesthesia Transfer of Care Note  Patient: Manuel Nguyen  Procedure(s) Performed: COLONOSCOPY WITH PROPOFOL (N/A ) POLYPECTOMY  Patient Location: PACU  Anesthesia Type:MAC  Level of Consciousness: awake, alert , oriented and patient cooperative  Airway & Oxygen Therapy: Patient Spontanous Breathing  Post-op Assessment: Report given to RN and Post -op Vital signs reviewed and stable  Post vital signs: Reviewed and stable  Last Vitals:  Vitals Value Taken Time  BP    Temp    Pulse 29 01/18/2018  9:51 AM  Resp 9 01/18/2018  9:51 AM  SpO2 84 % 01/18/2018  9:51 AM  Vitals shown include unvalidated device data.  Last Pain:  Vitals:   01/18/18 0908  TempSrc:   PainSc: 0-No pain      Patients Stated Pain Goal: 7 (81/85/63 [id number])  Complications: No apparent anesthesia complications

## 2018-01-18 NOTE — Op Note (Signed)
[facility] Patient Name: Manuel Nguyen Procedure Date: 01/18/2018 8:29 AM MRN: [id number] Date of Birth: Aug 12, 1950 Attending MD: Norvel Richards , MD CSN: [id number] Age: 67 Admit Type: Outpatient Procedure:                Colonoscopy Indications:              Screening for colorectal malignant neoplasm Providers:                Norvel Richards, MD, Jeanann Lewandowsky. Sharon Seller, RN,                            Aram Candela Referring MD:             Barry Dienes Medicines:                Propofol per Anesthesia Complications:            No immediate complications. Estimated Blood Loss:     Estimated blood loss was minimal. Procedure:                Pre-Anesthesia Assessment:                           - Prior to the procedure, a History and Physical                            was performed, and patient medications and                            allergies were reviewed. The patient's tolerance of                            previous anesthesia was also reviewed. The risks                            and benefits of the procedure and the sedation                            options and risks were discussed with the patient.                            All questions were answered, and informed consent                            was obtained. Prior Anticoagulants: The patient has                            taken no previous anticoagulant or antiplatelet                            agents. ASA Grade Assessment: II - A patient with                            mild systemic disease. After reviewing the risks  and benefits, the patient was deemed in                            satisfactory condition to undergo the procedure.                           After obtaining informed consent, the colonoscope                            was passed under direct vision. Throughout the                            procedure, the patient's blood pressure, pulse, and                             oxygen saturations were monitored continuously. The                            [id number]-[id number] ([id number]) scope was introduced through                            the and advanced to the the cecum, identified by                            appendiceal orifice and ileocecal valve. Scope In: 9:13:17 AM Scope Out: 9:44:20 AM Scope Withdrawal Time: 0 hours 28 minutes 5 seconds  Total Procedure Duration: 0 hours 31 minutes 3 seconds  Findings:      The perianal and digital rectal examinations were normal.      Nine semi-pedunculated polyps were found in the sigmoid colon,       descending colon, hepatic flexure and cecum. The polyps were 8 to 16 mm       in size. These polyps were removed with a hot snare. Resection and       retrieval were complete. Estimated blood loss: none.      A 5 mm polyp was found in the hepatic flexure. The polyp was sessile.       The polyp was removed with a cold snare. Resection and retrieval were       complete. Estimated blood loss was minimal.      The exam was otherwise without abnormality on direct and retroflexion       views. Impression:               - Nine 8 to 16 mm polyps in the sigmoid colon, in                            the descending colon, at the hepatic flexure and in                            the cecum, removed with a hot snare. Resected and                            retrieved.                           -  One 5 mm polyp at the hepatic flexure, removed                            with a cold snare. Resected and retrieved.                           - The examination was otherwise normal on direct                            and retroflexion views. Moderate Sedation:      Moderate (conscious) sedation was personally administered by an       anesthesia professional. The following parameters were monitored: oxygen       saturation, heart rate, blood pressure, respiratory rate, EKG, adequacy       of pulmonary ventilation, and response to  care. Recommendation:           - Patient has a contact number available for                            emergencies. The signs and symptoms of potential                            delayed complications were discussed with the                            patient. Return to normal activities tomorrow.                            Written discharge instructions were provided to the                            patient.                           - Resume previous diet.                           - Continue present medications. Hold aspirin?"all                            doses- and nonsteroidal agents x10 days and then                            nay resume                           - Repeat colonoscopy date to be determined after                            pending pathology results are reviewed for                            surveillance based on pathology results.                           - Return to  GI office. Procedure Code(s):        --- Professional ---                           [phone], Colonoscopy, flexible; with removal of                            tumor(s), polyp(s), or other lesion(s) by snare                            technique Diagnosis Code(s):        --- Professional ---                           Z12.11, Encounter for screening for malignant                            neoplasm of colon                           D12.5, Benign neoplasm of sigmoid colon                           D12.4, Benign neoplasm of descending colon                           D12.3, Benign neoplasm of transverse colon (hepatic                            flexure or splenic flexure)                           D12.0, Benign neoplasm of cecum CPT copyright 2018 American Medical Association. All rights reserved. The codes documented in this report are preliminary and upon coder review may  be revised to meet current compliance requirements. Cristopher Estimable. Vaness Jelinski, MD Norvel Richards, MD 01/18/2018 9:55:21 AM This  report has been signed electronically. Number of Addenda: 0

## 2018-01-18 NOTE — H&P (Signed)
@LOGO @   Primary Care Physician:  Barry Dienes, NP Primary Gastroenterologist:  Dr. Gala Romney  Pre-Procedure History & Physical: HPI:  Manuel Nguyen is a 67 y.o. male is here for a screening colonoscopy.  No bowel symptoms.  No prior colonoscopy.  No family history of colon cancer.  Past Medical History:  Diagnosis Date  . Coronary artery disease 08/2011   With drug-eluting stent to right coronary artery and obtuse marginal vessel  . Hyperlipemia   . Hypertension     Past Surgical History:  Procedure Laterality Date  . CARDIAC CATHETERIZATION     Drug-eluting stent to proximal right coronary artery and first acute marginal vessel  . LEFT HEART CATHETERIZATION WITH CORONARY ANGIOGRAM N/A 08/16/2011   Procedure: LEFT HEART CATHETERIZATION WITH CORONARY ANGIOGRAM;  Surgeon: Peter M Martinique, MD;  Location: [facility] CATH LAB;  Service: Cardiovascular;  Laterality: N/A;    Prior to Admission medications   Medication Sig Start Date End Date Taking? Authorizing Provider  aspirin EC 81 MG tablet Take 1 tablet (81 mg total) by mouth daily. Patient taking differently: Take 81 mg by mouth daily as needed.  08/03/17  Yes Herminio Commons, MD  ezetimibe (ZETIA) 10 MG tablet TAKE 1 TABLET BY MOUTH ONCE DAILY 06/19/17  Yes Herminio Commons, MD  metoprolol (LOPRESSOR) 50 MG tablet Take 1 tablet (50 mg total) by mouth 2 (two) times daily. Patient taking differently: Take 50 mg by mouth daily.  02/26/15  Yes Arnoldo Lenis, MD  valsartan (DIOVAN) 160 MG tablet Take 160 mg by mouth daily.   Yes [provider]    Allergies as of 12/18/2017  . (No Known Allergies)    Family History  Problem Relation Age of Onset  . Diabetes Brother   . Diabetes Sister   . Hypertension Brother   . Hypertension Sister   . Hyperlipidemia Sister   . Hyperlipidemia Brother   . Colon cancer Neg Hx     Social History   Socioeconomic History  . Marital status: Divorced    Spouse name: Not on file  .  Number of children: Not on file  . Years of education: Not on file  . Highest education level: Not on file  Occupational History  . Not on file  Social Needs  . Financial resource strain: Not on file  . Food insecurity:    Worry: Not on file    Inability: Not on file  . Transportation needs:    Medical: Not on file    Non-medical: Not on file  Tobacco Use  . Smoking status: Current Some Day Smoker    Types: Cigarettes, Cigars    Start date: 12/21/1964    Last attempt to quit: 08/15/1985    Years since quitting: 32.4  . Smokeless tobacco: Former Systems developer    Types: [place] date: 11/26/1993  . Tobacco comment: Cigar once a week (as of 12/14/17)  Substance and Sexual Activity  . Alcohol use: Yes    Alcohol/week: 0.0 standard drinks    Comment: Small drink once a week (weekend) as of 12/14/17  . Drug use: No  . Sexual activity: Yes    Partners: Female  Lifestyle  . Physical activity:    Days per week: Not on file    Minutes per session: Not on file  . Stress: Not on file  Relationships  . Social connections:    Talks on phone: Not on file    Gets together: Not  on file    Attends religious service: Not on file    Active member of club or organization: Not on file    Attends meetings of clubs or organizations: Not on file    Relationship status: Not on file  . Intimate partner violence:    Fear of current or ex partner: Not on file    Emotionally abused: Not on file    Physically abused: Not on file    Forced sexual activity: Not on file  Other Topics Concern  . Not on file  Social History Narrative  . Not on file    Review of Systems: See HPI, otherwise negative ROS  Physical Exam: There were no vitals taken for this visit. General:   Alert,  Well-developed, well-nourished, pleasant and cooperative in NAD Lungs:  Clear throughout to auscultation.   No wheezes, crackles, or rhonchi. No acute distress. Heart:  Regular rate and rhythm; no murmurs, clicks, rubs,  or  gallops. Abdomen:  Soft, nontender and nondistended. No masses, hepatosplenomegaly or hernias noted. Normal bowel sounds, without guarding, and without rebound.    Impression/Plan: Manuel Nguyen is now here to undergo a screening colonoscopy.  First ever average risk screening examination.  Risks, benefits, limitations, imponderables and alternatives regarding colonoscopy have been reviewed with the patient. Questions have been answered. All parties agreeable.     Notice:  This dictation was prepared with Dragon dictation along with smaller phrase technology. Any transcriptional errors that result from this process are unintentional and may not be corrected upon review.

## 2018-01-18 NOTE — Anesthesia Postprocedure Evaluation (Signed)
Anesthesia Post Note  Patient: Manuel Nguyen  Procedure(s) Performed: COLONOSCOPY WITH PROPOFOL (N/A ) POLYPECTOMY  Patient location during evaluation: PACU Anesthesia Type: General Level of consciousness: awake and alert and oriented Pain management: pain level controlled Vital Signs Assessment: post-procedure vital signs reviewed and stable Respiratory status: spontaneous breathing Cardiovascular status: stable Postop Assessment: no apparent nausea or vomiting Anesthetic complications: no     Last Vitals:  Vitals:   01/18/18 0836  BP: 133/79  Pulse: 79  Resp: 16  Temp: 36.9 C  SpO2: 97%    Last Pain:  Vitals:   01/18/18 0908  TempSrc:   PainSc: 0-No pain                 ADAMS, AMY A

## 2018-01-18 NOTE — Progress Notes (Signed)
   01/18/18 0844  OBSTRUCTIVE SLEEP APNEA  Have you ever been diagnosed with sleep apnea through a sleep study? No  Do you snore loudly (loud enough to be heard through closed doors)?  1  Do you often feel tired, fatigued, or sleepy during the daytime (such as falling asleep during driving or talking to someone)? 0  Has anyone observed you stop breathing during your sleep? 0  Do you have, or are you being treated for high blood pressure? 1  BMI more than 35 kg/m2? 0  Age > 50 (1-yes) 1  Neck circumference greater than:Male 16 inches or larger, Male 17inches or larger? 1  Male Gender (Yes=1) 1  Obstructive Sleep Apnea Score 5

## 2018-01-18 NOTE — Anesthesia Preprocedure Evaluation (Signed)
Anesthesia Evaluation  Patient identified by MRN, date of birth, ID band Patient awake    Reviewed: Allergy & Precautions, NPO status , Patient's Chart, lab work & pertinent test results, reviewed documented beta blocker date and time   Airway Mallampati: I  TM Distance: >3 FB Neck ROM: Limited    Dental no notable dental hx. (+) Edentulous Upper, Edentulous Lower   Pulmonary neg pulmonary ROS, Current Smoker,  occ cigars - states not everyday   Pulmonary exam normal breath sounds clear to auscultation       Cardiovascular Exercise Tolerance: Good hypertension, Pt. on medications + CAD, + Past MI and + Cardiac Stents  Normal cardiovascular examI Rhythm:Regular Rate:Normal  States MI/ 2 stents ~2014 Denies recent CP/DOE States good ET- denies any NTG use    Neuro/Psych negative neurological ROS  negative psych ROS   GI/Hepatic negative GI ROS, Neg liver ROS,   Endo/Other  diabetes, Type 2, Oral Hypoglycemic Agents  Renal/GU negative Renal ROS  negative genitourinary   Musculoskeletal negative musculoskeletal ROS (+)   Abdominal   Peds negative pediatric ROS (+)  Hematology negative hematology ROS (+)   Anesthesia Other Findings   Reproductive/Obstetrics negative OB ROS                             Anesthesia Physical Anesthesia Plan  ASA: III  Anesthesia Plan: General   Post-op Pain Management:    Induction: Intravenous  PONV Risk Score and Plan:   Airway Management Planned: Nasal Cannula and Simple Face Mask  Additional Equipment:   Intra-op Plan:   Post-operative Plan:   Informed Consent: I have reviewed the patients History and Physical, chart, labs and discussed the procedure including the risks, benefits and alternatives for the proposed anesthesia with the patient or authorized representative who has indicated his/her understanding and acceptance.   Dental advisory  given  Plan Discussed with: CRNA  Anesthesia Plan Comments:         Anesthesia Quick Evaluation

## 2018-01-18 NOTE — Discharge Instructions (Signed)
Colonoscopy Discharge Instructions  Read the instructions outlined below and refer to this sheet in the next few weeks. These discharge instructions provide you with general information on caring for yourself after you leave the hospital. Your doctor may also give you specific instructions. While your treatment has been planned according to the most current medical practices available, unavoidable complications occasionally occur. If you have any problems or questions after discharge, call Dr. Gala Romney at [phone]. ACTIVITY  You may resume your regular activity, but move at a slower pace for the next 24 hours.   Take frequent rest periods for the next 24 hours.   Walking will help get rid of the air and reduce the bloated feeling in your belly (abdomen).   No driving for 24 hours (because of the medicine (anesthesia) used during the test).    Do not sign any important legal documents or operate any machinery for 24 hours (because of the anesthesia used during the test).  NUTRITION  Drink plenty of fluids.   You may resume your normal diet as instructed by your doctor.   Begin with a light meal and progress to your normal diet. Heavy or fried foods are harder to digest and may make you feel sick to your stomach (nauseated).   Avoid alcoholic beverages for 24 hours or as instructed.  MEDICATIONS  You may resume your normal medications unless your doctor tells you otherwise.  WHAT YOU CAN EXPECT TODAY  Some feelings of bloating in the abdomen.   Passage of more gas than usual.   Spotting of blood in your stool or on the toilet paper.  IF YOU HAD POLYPS REMOVED DURING THE COLONOSCOPY:  No aspirin products for 7 days or as instructed.   No alcohol for 7 days or as instructed.   Eat a soft diet for the next 24 hours.  FINDING OUT THE RESULTS OF YOUR TEST Not all test results are available during your visit. If your test results are not back during the visit, make an appointment  with your caregiver to find out the results. Do not assume everything is normal if you have not heard from your caregiver or the medical facility. It is important for you to follow up on all of your test results.  SEEK IMMEDIATE MEDICAL ATTENTION IF:  You have more than a spotting of blood in your stool.   Your belly is swollen (abdominal distention).   You are nauseated or vomiting.   You have a temperature over 101.   You have abdominal pain or discomfort that is severe or gets worse throughout the day.   Colon polyp information provided  No aspirin  - any dose or NSAIDs x10 days  Further recommendations to follow pending review of pathology report    Colon Polyps Polyps are tissue growths inside the body. Polyps can grow in many places, including the large intestine (colon). A polyp may be a round bump or a mushroom-shaped growth. You could have one polyp or several. Most colon polyps are noncancerous (benign). However, some colon polyps can become cancerous over time. What are the causes? The exact cause of colon polyps is not known. What increases the risk? This condition is more likely to develop in people who:  Have a family history of colon cancer or colon polyps.  Are older than 46 or older than 45 if they are African American.  Have inflammatory bowel disease, such as ulcerative colitis or Crohn disease.  Are overweight.  Smoke cigarettes.  Do not get enough exercise.  Drink too much alcohol.  Eat a diet that is: ? High in fat and red meat. ? Low in fiber.  Had childhood cancer that was treated with abdominal radiation.  What are the signs or symptoms? Most polyps do not cause symptoms. If you have symptoms, they may include:  Blood coming from your rectum when having a bowel movement.  Blood in your stool.The stool may look dark red or black.  A change in bowel habits, such as constipation or diarrhea.  How is this diagnosed? This condition is  diagnosed with a colonoscopy. This is a procedure that uses a lighted, flexible scope to look at the inside of your colon. How is this treated? Treatment for this condition involves removing any polyps that are found. Those polyps will then be tested for cancer. If cancer is found, your health care provider will talk to you about options for colon cancer treatment. Follow these instructions at home: Diet  Eat plenty of fiber, such as fruits, vegetables, and whole grains.  Eat foods that are high in calcium and vitamin D, such as milk, cheese, yogurt, eggs, liver, fish, and broccoli.  Limit foods high in fat, red meats, and processed meats, such as hot dogs, sausage, bacon, and lunch meats.  Maintain a healthy weight, or lose weight if recommended by your health care provider. General instructions  Do not smoke cigarettes.  Do not drink alcohol excessively.  Keep all follow-up visits as told by your health care provider. This is important. This includes keeping regularly scheduled colonoscopies. Talk to your health care provider about when you need a colonoscopy.  Exercise every day or as told by your health care provider. Contact a health care provider if:  You have new or worsening bleeding during a bowel movement.  You have new or increased blood in your stool.  You have a change in bowel habits.  You unexpectedly lose weight. This information is not intended to replace advice given to you by your health care provider. Make sure you discuss any questions you have with your health care provider. Document Released: 12/09/2003 Document Revised: 08/20/2015 Document Reviewed: 02/02/2015 Elsevier Interactive Patient Education  2018 [place], Care After These instructions provide you with information about caring for yourself after your procedure. Your health care provider may also give you more specific instructions. Your treatment has been  planned according to current medical practices, but problems sometimes occur. Call your health care provider if you have any problems or questions after your procedure. What can I expect after the procedure? After your procedure, it is common to:  Feel sleepy for several hours.  Feel clumsy and have poor balance for several hours.  Feel forgetful about what happened after the procedure.  Have poor judgment for several hours.  Feel nauseous or vomit.  Have a sore throat if you had a breathing tube during the procedure.  Follow these instructions at home: For at least 24 hours after the procedure:   Do not: ? Participate in activities in which you could fall or become injured. ? Drive. ? Use heavy machinery. ? Drink alcohol. ? Take sleeping pills or medicines that cause drowsiness. ? Make important decisions or sign legal documents. ? Take care of children on your own.  Rest. Eating and drinking  Follow the diet that is recommended by your health care provider.  If you vomit, drink water, juice, or soup when  you can drink without vomiting.  Make sure you have little or no nausea before eating solid foods. General instructions  Have a responsible adult stay with you until you are awake and alert.  Take over-the-counter and prescription medicines only as told by your health care provider.  If you smoke, do not smoke without supervision.  Keep all follow-up visits as told by your health care provider. This is important. Contact a health care provider if:  You keep feeling nauseous or you keep vomiting.  You feel light-headed.  You develop a rash.  You have a fever. Get help right away if:  You have trouble breathing. This information is not intended to replace advice given to you by your health care provider. Make sure you discuss any questions you have with your health care provider. Document Released: 07/05/2015 Document Revised: 11/04/2015 Document Reviewed:  07/05/2015 Elsevier Interactive Patient Education  [employer].

## 2018-01-18 NOTE — Anesthesia Procedure Notes (Signed)
Procedure Name: General with mask airway Date/Time: 01/18/2018 9:05 AM Performed by: Andree Elk Sharonda Llamas A, CRNA Pre-anesthesia Checklist: Patient identified, Emergency Drugs available, Suction available, Patient being monitored and Timeout performed

## 2018-01-24 ENCOUNTER — Encounter: Payer: Self-pay | Admitting: Internal Medicine

## 2018-01-24 ENCOUNTER — Encounter (HOSPITAL_COMMUNITY): Payer: Self-pay | Admitting: Internal Medicine

## 2018-01-31 ENCOUNTER — Encounter: Payer: Self-pay | Admitting: Cardiovascular Disease

## 2018-01-31 ENCOUNTER — Ambulatory Visit (INDEPENDENT_AMBULATORY_CARE_PROVIDER_SITE_OTHER): Payer: Medicare HMO | Admitting: Cardiovascular Disease

## 2018-01-31 VITALS — BP 126/78 | HR 84 | Ht 69.0 in | Wt 220.0 lb

## 2018-01-31 DIAGNOSIS — I10 Essential (primary) hypertension: Secondary | ICD-10-CM | POA: Diagnosis not present

## 2018-01-31 DIAGNOSIS — E782 Mixed hyperlipidemia: Secondary | ICD-10-CM

## 2018-01-31 DIAGNOSIS — I25118 Atherosclerotic heart disease of native coronary artery with other forms of angina pectoris: Secondary | ICD-10-CM

## 2018-01-31 DIAGNOSIS — Z9114 Patient's other noncompliance with medication regimen: Secondary | ICD-10-CM | POA: Diagnosis not present

## 2018-01-31 DIAGNOSIS — Z955 Presence of coronary angioplasty implant and graft: Secondary | ICD-10-CM

## 2018-01-31 NOTE — Progress Notes (Signed)
SUBJECTIVE: The patient presents for routine follow up. 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  He has been intolerant to both Crestor and pravastatin in the past, as it led to myalgias. Zetia only reduced LDL to 199 previously.  He stopped taking it nonetheless.  He was not able to afford Repatha in the past.    My staff is made several attempts to work with him and his insurance company.  It was finally approved.  When speaking with my staff, it appeared he never called the number he was supposed to.  I asked him about this today and he said he never called anyone and claims he does not remember being told to do so.  He is known to be noncompliant with both medications and dietary recommendations.  He continues to eat fried chicken on a daily basis and says "I am hooked ".  He brought in his blood work for me to review dated 01/01/2018: Total cholesterol markedly elevated at 297, triglycerides 182, HDL 38, LDL markedly elevated at 223, BUN 17, creatinine elevated at 1.75.  He has acid reflux symptoms but denies exertional chest pain and dyspnea.  He also denies palpitations and leg swelling.  Review of Systems: As per "subjective", otherwise negative.  No Known Allergies  Current Outpatient Medications  Medication Sig Dispense Refill  . aspirin EC 81 MG tablet Take 1 tablet (81 mg total) by mouth daily. (Patient taking differently: Take 81 mg by mouth daily as needed. ) 90 tablet 3  . ezetimibe (ZETIA) 10 MG tablet TAKE 1 TABLET BY MOUTH ONCE DAILY 30 tablet 6  . metoprolol (LOPRESSOR) 50 MG tablet Take 1 tablet (50 mg total) by mouth 2 (two) times daily. (Patient taking differently: Take 50 mg by mouth daily. ) 60  tablet 6  . valsartan (DIOVAN) 160 MG tablet Take 160 mg by mouth daily.     No current facility-administered medications for this visit.     Past Medical History:  Diagnosis Date  . Coronary artery disease 08/2011   With drug-eluting stent to right coronary artery and obtuse marginal vessel  . Hyperlipemia   . Hypertension     Past Surgical History:  Procedure Laterality Date  . CARDIAC CATHETERIZATION     Drug-eluting stent to proximal right coronary artery and first acute marginal vessel  . COLONOSCOPY WITH PROPOFOL N/A 01/18/2018   Procedure: COLONOSCOPY WITH PROPOFOL;  Surgeon: Daneil Dolin, MD;  Location: [facility];  Service: Endoscopy;  Laterality: N/A;  10:15am  . LEFT HEART CATHETERIZATION WITH CORONARY ANGIOGRAM N/A 08/16/2011   Procedure: LEFT HEART CATHETERIZATION WITH CORONARY ANGIOGRAM;  Surgeon: Peter M Martinique, MD;  Location: [facility] CATH LAB;  Service: Cardiovascular;  Laterality: N/A;  . POLYPECTOMY  01/18/2018   Procedure: POLYPECTOMY;  Surgeon: Daneil Dolin, MD;  Location: [facility];  Service: Endoscopy;;  colon    Social History   Socioeconomic History  . Marital status: Divorced    Spouse name: Not on file  . Number of children: Not on file  . Years of education: Not on file  . Highest education level: Not on file  Occupational History  . Not on file  Social Needs  .  Financial resource strain: Not on file  . Food insecurity:    Worry: Not on file    Inability: Not on file  . Transportation needs:    Medical: Not on file    Non-medical: Not on file  Tobacco Use  . Smoking status: Current Some Day Smoker    Types: Cigarettes, Cigars    Start date: 12/21/1964    Last attempt to quit: 08/15/1985    Years since quitting: 32.4  . Smokeless tobacco: Former Systems developer    Types: [place] date: 11/26/1993  . Tobacco comment: Cigar once a week (as of 12/14/17)  Substance and Sexual Activity  . Alcohol use: Yes    Alcohol/week: 0.0 standard drinks     Comment: Small drink once a week (weekend) as of 12/14/17  . Drug use: No  . Sexual activity: Yes    Partners: Female  Lifestyle  . Physical activity:    Days per week: Not on file    Minutes per session: Not on file  . Stress: Not on file  Relationships  . Social connections:    Talks on phone: Not on file    Gets together: Not on file    Attends religious service: Not on file    Active member of club or organization: Not on file    Attends meetings of clubs or organizations: Not on file    Relationship status: Not on file  . Intimate partner violence:    Fear of current or ex partner: Not on file    Emotionally abused: Not on file    Physically abused: Not on file    Forced sexual activity: Not on file  Other Topics Concern  . Not on file  Social History Narrative  . Not on file     Vitals:   01/31/18 1343  BP: 126/78  Pulse: 84  SpO2: 98%  Weight: 220 lb (99.8 kg)  Height: 5\' 9"  (1.753 m)    Wt Readings from Last 3 Encounters:  01/31/18 220 lb (99.8 kg)  01/18/18 215 lb (97.5 kg)  12/14/17 221 lb 6.4 oz (100.4 kg)     PHYSICAL EXAM General: NAD HEENT: Normal. Neck: No JVD, no thyromegaly. Lungs: Clear to auscultation bilaterally with normal respiratory effort. CV: Regular rate and rhythm, normal S1/S2, no S3/S4, no murmur. No pretibial or periankle edema.  No carotid bruit.   Abdomen: Soft, nontender, no distention.  Neurologic: Alert and oriented.  Psych: Normal affect. Skin: Normal. Musculoskeletal: No gross deformities.    ECG: Reviewed above under Subjective   Labs: Lab Results  Component Value Date/Time   K 3.6 08/18/2011 06:12 AM   BUN 9 08/18/2011 06:12 AM   CREATININE 1.12 08/18/2011 06:12 AM   ALT 24 08/16/2011 10:18 PM   TSH 0.751 08/16/2011 10:18 PM   HGB 13.4 08/17/2011 04:28 AM     Lipids: Lab Results  Component Value Date/Time   LDLCALC 226 (H) 08/17/2011 09:54 AM   CHOL 300 (H) 08/17/2011 09:54 AM   TRIG 171 (H) 08/17/2011  09:54 AM   HDL 40 08/17/2011 09:54 AM       ASSESSMENT AND PLAN:  1. CAD/ischemic heart disease: Symptomatically stable. Continue aspirin 81 mg and metoprolol 50 mg twice daily. He has not been able to tolerate statins.  He takes Zetia but has markedly elevated lipids as reviewed above.  He never called the phone number to obtain Repatha as his insurance approved it.  I have reinforced  the severity of his dyslipidemia and again encouraged him to call the phone number so that he can start [place].  2. Essential HTN: Controlled. No changes.  3. Dyslipidemia:Lipids reviewed above with markedly elevated total cholesterol and LDL. He has not been able to tolerate statins.  He takes Zetia but has markedly elevated lipids as reviewed above.  He never called the phone number to obtain Repatha as his insurance approved it.  I have reinforced the severity of his dyslipidemia and again encouraged him to call the phone number so that he can start [place].  4.  Chronic kidney disease stage III: Labs reviewed above with creatinine 1.75.   Disposition: Follow up 6 months   Kate Sable, M.D., F.A.C.C.

## 2018-01-31 NOTE — Patient Instructions (Addendum)
Medication Instructions:  Your physician recommends that you continue on your current medications as directed. Please refer to the Current Medication list given to you today.  If you need a refill on your cardiac medications before your next appointment, please call your pharmacy.   Lab work: none If you have labs (blood work) drawn today and your tests are completely normal, you will receive your results only by: Marland Kitchen MyChart Message (if you have MyChart) OR . A paper copy in the mail If you have any lab test that is abnormal or we need to change your treatment, we will call you to review the results.  Testing/Procedures: none  Follow-Up: At [facility], you and your health needs are our priority.  As part of our continuing mission to provide you with exceptional heart care, we have created designated Provider Care Teams.  These Care Teams include your primary Cardiologist (physician) and Advanced Practice Providers (APPs -  Physician Assistants and Nurse Practitioners) who all work together to provide you with the care you need, when you need it. You will need a follow up appointment in 6 months.  Please call our office 2 months in advance to schedule this appointment.  You may see Kate Sable, MD or one of the following Advanced Practice Providers on your designated Care Team:   Bernerd Pho, PA-C [facility]) . Ermalinda Barrios, PA-C ([place])  Any Other Special Instructions Will Be Listed Below (If Applicable).     Call [place] for your Repatha medicine, Addendum : I spoke with Raquel at [facility], they will call patient to schedule apt    [phone]

## 2018-02-01 ENCOUNTER — Telehealth: Payer: Self-pay | Admitting: *Deleted

## 2018-02-01 NOTE — Telephone Encounter (Signed)
Left message for patient to call and schedule Pharm D appointment to discuss Repatha as ordered by Dr. Bronson Ing

## 2018-02-06 ENCOUNTER — Ambulatory Visit: Payer: Medicare HMO | Admitting: Pharmacist

## 2018-02-06 DIAGNOSIS — E782 Mixed hyperlipidemia: Secondary | ICD-10-CM

## 2018-02-06 NOTE — Progress Notes (Signed)
Patient ID: Manuel Nguyen                 DOB: December 11, 1950                    MRN: [id number]     HPI: Manuel Nguyen is a 67 y.o. male patient referred to lipid clinic by Dr Bronson Ing. PMH is significant for hyperlipidemia, CAD s/p STEMI in May 2013, hypertension, and diabetes. Patient presents to clinic for potential PCSK9i initiation and counseling. His insurance approve a prior authorization for Repatha in the past but the co-pay was too high for patient to afford. He denies problems with any current medication, and continues to work on positive lifestyle modifications.  Current Medications: ezetimibe 10mg  daily  Intolerances:   Atorvastatin 80mg  daily (08/20/2011 to 10/20/2011) Rosuvastatin 5mg  daily (10/20/2011 to 06/05/2012) Pravastatin 40mg  daily (07/03/2012 to 12/17/2012) Pitavastatin 2mg  daily (06/14/2016 to 08/03/2017)  LDL goal: 70mg /dL  Diet: eating lots of sweets , trying to eat more baked foods, and more vegetables  Exercise: walking at work Doctor, general practice )  Family History: CABG in brother,   Social History: smoker, occassional alcohol  Labs: 01/01/2018: CHO 297; TG 182; HDL 38; LDL-c 223   Past Medical History:  Diagnosis Date  . Coronary artery disease 08/2011   With drug-eluting stent to right coronary artery and obtuse marginal vessel  . Hyperlipemia   . Hypertension     Current Outpatient Medications on File Prior to Visit  Medication Sig Dispense Refill  . aspirin EC 81 MG tablet Take 1 tablet (81 mg total) by mouth daily. (Patient taking differently: Take 81 mg by mouth daily as needed. ) 90 tablet 3  . ezetimibe (ZETIA) 10 MG tablet TAKE 1 TABLET BY MOUTH ONCE DAILY 30 tablet 6  . metoprolol (LOPRESSOR) 50 MG tablet Take 1 tablet (50 mg total) by mouth 2 (two) times daily. (Patient taking differently: Take 50 mg by mouth daily. ) 60 tablet 6  . valsartan (DIOVAN) 160 MG tablet Take 160 mg by mouth daily.     No current facility-administered medications  on file prior to visit.     No Known Allergies  Hyperlipidemia LDL remains above goal for secondary prevention. Therapy with PCSK9i is an appropriate next step due to high cardiovascular risk and intolerance to multiple statins. Patient continues to work on positive lifestyle modifications as intrusted.   PCSK9i indication, MOA, storage, common side effects, administration, PA process, and patietn assistance pogram were discussed during office visit. Will start a[pproval process for Repatha 140mg  [place] every 14 days and follow up with patient 3 months after initiating therapy.  1st Repatha dose given in clinic (self-administration) Lot [id number]; exp 05/2020 as part of teaching and to determine potential for allergic reaction.    Lithzy Bernard Rodriguez-Guzman PharmD, BCPS, [place] [id number] 02/09/2018 9:54 AM

## 2018-02-06 NOTE — Patient Instructions (Addendum)
Lipid Clinic (pharmacist) Richard Ritchey/Kristin [phone]  *START paperwork for Repatha 140mg  every 14 days* *Plan to repeat blood work 3 months after initating Repatha*     Cholesterol Cholesterol is a fat. Your body needs a small amount of cholesterol. Cholesterol (plaque) may build up in your blood vessels (arteries). That makes you more likely to have a heart attack or stroke. You cannot feel your cholesterol level. Having a blood test is the only way to find out if your level is high. Keep your test results. Work with your doctor to keep your cholesterol at a good level. What do the results mean?  Total cholesterol is how much cholesterol is in your blood.  LDL is bad cholesterol. This is the type that can build up. Try to have low LDL.  HDL is good cholesterol. It cleans your blood vessels and carries LDL away. Try to have high HDL.  Triglycerides are fat that the body can store or burn for energy. What are good levels of cholesterol?  Total cholesterol below 200.  LDL below 100 is good for people who have health risks. LDL below 70 is good for people who have very high risks.  HDL above 40 is good. It is best to have HDL of 60 or higher.  Triglycerides below 150. How can I lower my cholesterol? Diet Follow your diet program as told by your doctor.  Choose fish, white meat chicken, or [place] that is roasted or baked. Try not to eat red meat, fried foods, sausage, or lunch meats.  Eat lots of fresh fruits and vegetables.  Choose whole grains, beans, pasta, potatoes, and cereals.  Choose olive oil, corn oil, or canola oil. Only use small amounts.  Try not to eat butter, mayonnaise, shortening, or palm kernel oils.  Try not to eat foods with trans fats.  Choose low-fat or nonfat dairy foods. ? Drink skim or nonfat milk. ? Eat low-fat or nonfat yogurt and cheeses. ? Try not to drink whole milk or cream. ? Try not to eat ice cream, egg yolks, or full-fat  cheeses.  Healthy desserts include angel food cake, ginger snaps, animal crackers, hard candy, popsicles, and low-fat or nonfat frozen yogurt. Try not to eat pastries, cakes, pies, and cookies.  Exercise Follow your exercise program as told by your doctor.  Be more active. Try gardening, walking, and taking the stairs.  Ask your doctor about ways that you can be more active.  Medicine  Take over-the-counter and prescription medicines only as told by your doctor. This information is not intended to replace advice given to you by your health care provider. Make sure you discuss any questions you have with your health care provider. Document Released: 06/10/2008 Document Revised: 10/14/2015 Document Reviewed: 09/24/2015 Elsevier Interactive Patient Education  [employer].

## 2018-02-09 ENCOUNTER — Encounter: Payer: Self-pay | Admitting: Pharmacist

## 2018-02-09 NOTE — Assessment & Plan Note (Signed)
LDL remains above goal for secondary prevention. Therapy with PCSK9i is an appropriate next step due to high cardiovascular risk and intolerance to multiple statins. Patient continues to work on positive lifestyle modifications as intrusted.   PCSK9i indication, MOA, storage, common side effects, administration, PA process, and patietn assistance pogram were discussed during office visit. Will start a[pproval process for Repatha 140mg  [place] every 14 days and follow up with patient 3 months after initiating therapy.  1st Repatha dose given in clinic (self-administration) Lot [id number]; exp 05/2020 as part of teaching and to determine potential for allergic reaction.

## 2018-02-16 ENCOUNTER — Telehealth: Payer: Self-pay | Admitting: Cardiovascular Disease

## 2018-02-16 NOTE — Telephone Encounter (Signed)
Patient is calling to ask if he was approved for Repatha/tg

## 2018-02-16 NOTE — Telephone Encounter (Signed)
Update given to patient.  PA for Repatha was approved, paperwork for [place] submitted. Still waiting for [employer] response, may take 7-10 working days.  Patient expressed understanding.

## 2018-02-16 NOTE — Telephone Encounter (Signed)
I told him he was approved but needs to call Raquel at [facility] office.I gave him the number and spelled her name for him

## 2018-02-28 ENCOUNTER — Telehealth: Payer: Self-pay

## 2018-02-28 NOTE — Telephone Encounter (Signed)
Called to notify pt that [employer] needs them to call them to correct some errors on the pt application for assistance

## 2018-03-12 NOTE — Telephone Encounter (Signed)
Called pt to ask if he has called [employer] safety [employer] to make appropriate changes so that we can do the PA left message for pt to call back

## 2018-03-13 NOTE — Telephone Encounter (Signed)
Called pt to let them know the reason of denial was that they are eligible through low income subsidy and that the pt will have to call and go through the automated questions to apply also gave the pt the phone #

## 2018-03-13 NOTE — Telephone Encounter (Signed)
Pt called to make the office aware that he has called [employer] and answered the questions that were missinhg and pt also states that he was told that he was denied

## 2018-03-26 ENCOUNTER — Other Ambulatory Visit: Payer: Self-pay | Admitting: Cardiovascular Disease

## 2018-04-09 NOTE — Telephone Encounter (Signed)
Called pt to see if applied for low income subsidy they did and were denied but can't remember where they put paper and said they will call back once they find it so that we can seek out other avenues to help

## 2018-04-24 ENCOUNTER — Ambulatory Visit: Payer: Medicare HMO | Admitting: Urology

## 2018-04-24 DIAGNOSIS — R972 Elevated prostate specific antigen [PSA]: Secondary | ICD-10-CM | POA: Diagnosis not present

## 2018-05-29 ENCOUNTER — Telehealth: Payer: Self-pay

## 2018-05-29 NOTE — Telephone Encounter (Signed)
Called pt and left a detailed msg regarding wanting to know if they had found the letter to low income subsidy that they had lost to appeal a pt assistance denial for [employer]'s repatha. Instructed the pt to call us back in the office.

## 2018-08-07 ENCOUNTER — Ambulatory Visit: Payer: Medicare HMO | Admitting: Urology

## 2018-08-28 ENCOUNTER — Ambulatory Visit: Payer: Medicare HMO | Admitting: Cardiovascular Disease

## 2018-11-28 ENCOUNTER — Ambulatory Visit: Payer: Medicare HMO | Admitting: Cardiovascular Disease

## 2018-12-17 DIAGNOSIS — I2510 Atherosclerotic heart disease of native coronary artery without angina pectoris: Secondary | ICD-10-CM | POA: Insufficient documentation

## 2018-12-17 DIAGNOSIS — N183 Chronic kidney disease, stage 3 unspecified: Secondary | ICD-10-CM | POA: Insufficient documentation

## 2018-12-17 NOTE — Progress Notes (Signed)
Cardiology Office Note    Date:  12/18/2018   ID:  Dumont, Charania Mar 15, 1951, MRN [id number]  PCP:  [name], NP  Cardiologist: Kate Sable, MD EPS: None  No chief complaint on file.   History of Present Illness:  Manuel Nguyen is a 68 y.o. male with history of CAD S/P inf/lat STEMI 2013 treated with DES OM 1 and prox RCA with CTO mid RCA with left to right collaterals and normal LV funciton, HTN, HLD intolerant to statins now awaiting approval for Repatha. Also CKD stage 3.  Denies chest tightness, pressure, dizziness or presyncope. Some indigestion when he eats something that doesn't agree with him. Been out of Zetia for 3 weeks. Started smoking a cigar with covid19.      Past Medical History:  Diagnosis Date  . Coronary artery disease 08/2011   With drug-eluting stent to right coronary artery and obtuse marginal vessel  . Hyperlipemia   . Hypertension     Past Surgical History:  Procedure Laterality Date  . CARDIAC CATHETERIZATION     Drug-eluting stent to proximal right coronary artery and first acute marginal vessel  . COLONOSCOPY WITH PROPOFOL N/A 01/18/2018   Procedure: COLONOSCOPY WITH PROPOFOL;  Surgeon: Daneil Dolin, MD;  [facility];  Service: Endoscopy;  Laterality: N/A;  10:15am  . LEFT HEART CATHETERIZATION WITH CORONARY ANGIOGRAM N/A 08/16/2011   Procedure: LEFT HEART CATHETERIZATION WITH CORONARY ANGIOGRAM;  Surgeon: Peter M Martinique, MD;  Location: [facility] CATH LAB;  Service: Cardiovascular;  Laterality: N/A;  . POLYPECTOMY  01/18/2018   Procedure: POLYPECTOMY;  Surgeon: Daneil Dolin, MD;  Location: [facility];  Service: Endoscopy;;  colon    Current Medications: Current Meds  Medication Sig  . aspirin EC 81 MG tablet Take 1 tablet (81 mg total) by mouth daily. (Patient taking differently: Take 81 mg by mouth daily as needed. )  . metoprolol (LOPRESSOR) 50 MG tablet Take 1 tablet (50 mg total) by mouth 2 (two) times  daily. (Patient taking differently: Take 50 mg by mouth daily. )  . valsartan (DIOVAN) 160 MG tablet Take 160 mg by mouth daily.     Allergies:   Patient has no known allergies.   Social History   Socioeconomic History  . Marital status: Divorced    Spouse name: Not on file  . Number of children: Not on file  . Years of education: Not on file  . Highest education level: Not on file  Occupational History  . Not on file  Social Needs  . Financial resource strain: Not on file  . Food insecurity    Worry: Not on file    Inability: Not on file  . Transportation needs    Medical: Not on file    Non-medical: Not on file  Tobacco Use  . Smoking status: Current Some Day Smoker    Types: Cigarettes, Cigars    Start date: 12/21/1964    Last attempt to quit: 08/15/1985    Years since quitting: 33.3  . Smokeless tobacco: Former Systems developer    Types: [place] date: 11/26/1993  . Tobacco comment: Cigar once a week (as of 12/14/17)  Substance and Sexual Activity  . Alcohol use: Yes    Alcohol/week: 0.0 standard drinks    Comment: Small drink once a week (weekend) as of 12/14/17  . Drug use: No  . Sexual activity: Yes    Partners: Female  Lifestyle  .  Physical activity    Days per week: Not on file    Minutes per session: Not on file  . Stress: Not on file  Relationships  . Social Herbalist on phone: Not on file    Gets together: Not on file    Attends religious service: Not on file    Active member of club or organization: Not on file    Attends meetings of clubs or organizations: Not on file    Relationship status: Not on file  Other Topics Concern  . Not on file  Social History Narrative  . Not on file     Family History:  The patient's   family history includes Diabetes in his brother and sister; Hyperlipidemia in his brother and sister; Hypertension in his brother and sister.   ROS:   Please see the history of present illness.    ROS All other systems reviewed  and are negative.   PHYSICAL EXAM:   VS:  BP (!) 149/79   Pulse 82   Ht 5\' 9"  (1.753 m)   Wt 227 lb (103 kg)   BMI 33.52 kg/m   Physical Exam  GEN: Obese, in no acute distress  Neck: no JVD, carotid bruits, or masses Cardiac:RRR; no murmurs, rubs, or gallops  Respiratory:  clear to auscultation bilaterally, normal work of breathing GI: soft, nontender, nondistended, + BS Ext: without cyanosis, clubbing, or edema, Good distal pulses bilaterally Neuro:  Alert and Oriented x 3 Psych: euthymic mood, full affect  Wt Readings from Last 3 Encounters:  12/18/18 227 lb (103 kg)  01/31/18 220 lb (99.8 kg)  01/18/18 215 lb (97.5 kg)      Studies/Labs Reviewed:   EKG:  EKG is not ordered today.   Recent Labs: No results found for requested labs within last 8760 hours.   Lipid Panel    Component Value Date/Time   CHOL 300 (H) 08/17/2011 0954   TRIG 171 (H) 08/17/2011 0954   HDL 40 08/17/2011 0954   CHOLHDL 7.5 08/17/2011 0954   VLDL 34 08/17/2011 0954   LDLCALC 226 (H) 08/17/2011 0954    Additional studies/ records that were reviewed today include:  2 Decho 2013------------------------------------------------------------  Study Conclusions   - Left ventricle: Systolic function was normal. The    estimated ejection fraction was in the range of 55% to    60%. Basal posterior and basal anterolateral hypokinesis.    Features are consistent with a pseudonormal left    ventricular filling pattern, with concomitant abnormal    relaxation and increased filling pressure (grade 2    diastolic dysfunction).  - Aortic valve: There was no stenosis.  - Mitral valve: Mildly calcified annulus. Trivial    regurgitation.  - Left atrium: The atrium was mildly dilated.  - Right ventricle: The cavity size was normal. Systolic    function was normal.  - Pulmonary arteries: No complete TR doppler jet so unable    to estimate PA systolic pressure.  - Systemic veins: IVC measured 2.0 cm with  normal    respirophasic variation, suggesting RA pressure 6-10 mmHg.  Impressions:   - Normal LV size and systolic function, EF 0000000. Basal    posterior and basal anterolateral hypokinesis. Moderate    diastolic dysfunction. Normal RV size and systolic    function. No significant valvular dysfunction.   Cardiac Cath: 08/16/2011 Left mainstem: Normal.  Left anterior descending (LAD): There are moderate irregularities in the mid to distal vessel  up to 20%.  Left circumflex (LCx): There is a 40% stenosis at the ostium of the left circumflex. The first obtuse marginal vessel is occluded. The second marginal branch has irregularities up to 20%. The left circumflex then continues in the AV groove to the distal posterior lateral area. There are mild irregularities in the distal vessel as well.  Right coronary artery (RCA): The right coronary is a dominant vessel. There is an 80-90% focal stenosis in the proximal vessel. In the mid vessel after the takeoff of the right ventricular marginal branch there is total occlusion. There are left-to-right collaterals to the far distal right coronary.  Left ventriculography: Left ventricular systolic function abnormal. There is mid inferior wall hypokinesis. Overall systolic function is well preserved with an EF of 55%. PCI Data:  Vessel - RCA/Segment - proximal  Percent Stenosis (pre) 80-90%  TIMI-flow 3  Stent 3.0 x 12 mm Promus element  Percent Stenosis (post) 0%  TIMI-flow (post) 3  Vessel #2-RCA/segment mid  Percent stenosis (pre-) 100%  TIMI flow 0  Unable to cross with a wire. Vessel appears to be a chronic total occlusion.  Vessel #3-obtuse marginal vessel  Percent stenosis (pre-) 100%  TIMI flow 0  Stent 3.0 x 28 mm Promus element  Percent stenosis (post) 0%  TIMI flow (post) 3    ASSESSMENT:    1. Essential hypertension   2. Coronary artery disease involving native coronary artery of native heart without angina pectoris   3. Mixed  hyperlipidemia   4. CKD (chronic kidney disease), stage III (HCC)      PLAN:  In order of problems listed above:  CAD  S/P inf/lat STEMI 2013 treated with DES OM 1 and prox RCA with CTO mid RCA with left to right collaterals and normal LV funciton-no angina. On ASA Zetia, valsartan, metoprolol  Essential HTN BP controlled.  Hyperlipidemia-on Zetia, will reach out to pharmacist again about Repatha  CKD stage 3 check labs today   Medication Adjustments/Labs and Tests Ordered: Current medicines are reviewed at length with the patient today.  Concerns regarding medicines are outlined above.  Medication changes, Labs and Tests ordered today are listed in the Patient Instructions below. Patient Instructions  Medication Instructions:  Your physician recommends that you continue on your current medications as directed. Please refer to the Current Medication list given to you today.   Labwork: TODAY   Testing/Procedures: NONE  Follow-Up: Your physician wants you to follow-up in: 6 MONTHS.  You will receive a reminder letter in the mail two months in advance. If you don't receive a letter, please call our office to schedule the follow-up appointment.   Any Other Special Instructions Will Be Listed Below (If Applicable).     If you need a refill on your cardiac medications before your next appointment, please call your pharmacy.      Sumner Boast, PA-C  12/18/2018 12:39 PM    [facility] [address], [place], [place]  [address] Phone: [phone]; Fax: [phone]

## 2018-12-18 ENCOUNTER — Telehealth: Payer: Self-pay | Admitting: *Deleted

## 2018-12-18 ENCOUNTER — Encounter: Payer: Self-pay | Admitting: Physician Assistant

## 2018-12-18 ENCOUNTER — Other Ambulatory Visit (HOSPITAL_COMMUNITY)
Admission: RE | Admit: 2018-12-18 | Discharge: 2018-12-18 | Disposition: A | Discharge status: Home / Self Care | Payer: Medicare HMO | Source: Ambulatory Visit | Attending: Physician Assistant | Admitting: Physician Assistant

## 2018-12-18 ENCOUNTER — Ambulatory Visit (INDEPENDENT_AMBULATORY_CARE_PROVIDER_SITE_OTHER): Payer: Medicare HMO | Admitting: Physician Assistant

## 2018-12-18 ENCOUNTER — Other Ambulatory Visit: Payer: Self-pay

## 2018-12-18 ENCOUNTER — Telehealth: Payer: Self-pay

## 2018-12-18 VITALS — BP 149/79 | HR 82 | Ht 69.0 in | Wt 227.0 lb

## 2018-12-18 DIAGNOSIS — N183 Chronic kidney disease, stage 3 unspecified: Secondary | ICD-10-CM

## 2018-12-18 DIAGNOSIS — I2510 Atherosclerotic heart disease of native coronary artery without angina pectoris: Secondary | ICD-10-CM | POA: Insufficient documentation

## 2018-12-18 DIAGNOSIS — I10 Essential (primary) hypertension: Secondary | ICD-10-CM | POA: Insufficient documentation

## 2018-12-18 DIAGNOSIS — E782 Mixed hyperlipidemia: Secondary | ICD-10-CM

## 2018-12-18 LAB — COMPREHENSIVE METABOLIC PANEL
ALT: 24 U/L (ref 0–44)
AST: 21 U/L (ref 15–41)
Albumin: 3.9 g/dL (ref 3.5–5.0)
Alkaline Phosphatase: 48 U/L (ref 38–126)
Anion gap: 7 (ref 5–15)
BUN: 20 mg/dL (ref 8–23)
CO2: 23 mmol/L (ref 22–32)
Calcium: 9 mg/dL (ref 8.9–10.3)
Chloride: 106 mmol/L (ref 98–111)
Creatinine, Ser: 1.65 mg/dL — ABNORMAL HIGH (ref 0.61–1.24)
GFR calc Af Amer: 49 mL/min — ABNORMAL LOW (ref >60)
GFR calc non Af Amer: 42 mL/min — ABNORMAL LOW (ref >60)
Glucose, Bld: 117 mg/dL — ABNORMAL HIGH (ref 70–99)
Potassium: 4.1 mmol/L (ref 3.5–5.1)
Sodium: 136 mmol/L (ref 135–145)
Total Bilirubin: 0.4 mg/dL (ref 0.3–1.2)
Total Protein: 7.4 g/dL (ref 6.5–8.1)

## 2018-12-18 LAB — CBC WITH DIFFERENTIAL/PLATELET
Abs Immature Granulocytes: 0.01 10*3/uL (ref 0.00–0.07)
Basophils Absolute: 0.1 10*3/uL (ref 0.0–0.1)
Basophils Relative: 1 %
Eosinophils Absolute: 0.3 10*3/uL (ref 0.0–0.5)
Eosinophils Relative: 5 %
HCT: 41.8 % (ref 39.0–52.0)
Hemoglobin: 13.4 g/dL (ref 13.0–17.0)
Immature Granulocytes: 0 %
Lymphocytes Relative: 45 %
Lymphs Abs: 2.9 10*3/uL (ref 0.7–4.0)
MCH: 29.8 pg (ref 26.0–34.0)
MCHC: 32.1 g/dL (ref 30.0–36.0)
MCV: 92.9 fL (ref 80.0–100.0)
Monocytes Absolute: 0.7 10*3/uL (ref 0.1–1.0)
Monocytes Relative: 12 %
Neutro Abs: 2.4 10*3/uL (ref 1.7–7.7)
Neutrophils Relative %: 37 %
Platelets: 249 10*3/uL (ref 150–400)
RBC: 4.5 MIL/uL (ref 4.22–5.81)
RDW: 12.6 % (ref 11.5–15.5)
WBC: 6.3 10*3/uL (ref 4.0–10.5)
nRBC: 0 % (ref 0.0–0.2)

## 2018-12-18 LAB — TSH: TSH: 1.514 u[IU]/mL (ref 0.350–4.500)

## 2018-12-18 MED ORDER — EZETIMIBE 10 MG PO TABS: 10.0000 mg | ORAL_TABLET | Freq: Every day | ORAL | 3 refills | Status: DC

## 2018-12-18 NOTE — Telephone Encounter (Signed)
Called patient with test results. No answer. Left message to call back.  

## 2018-12-18 NOTE — Telephone Encounter (Signed)
lmomed the pt regarding the need for the denial from the low income subsidy to reapply for the snf for repatha

## 2018-12-18 NOTE — Patient Instructions (Signed)
Medication Instructions:  Your physician recommends that you continue on your current medications as directed. Please refer to the Current Medication list given to you today.   Labwork: TODAY   Testing/Procedures: NONE  Follow-Up: Your physician wants you to follow-up in: 6 MONTHS. You will receive a reminder letter in the mail two months in advance. If you don't receive a letter, please call our office to schedule the follow-up appointment.   Any Other Special Instructions Will Be Listed Below (If Applicable).     If you need a refill on your cardiac medications before your next appointment, please call your pharmacy.   

## 2018-12-18 NOTE — Telephone Encounter (Signed)
-----   Message from Imogene Burn, PA-C sent at 12/18/2018  2:59 PM EDT ----- Labs stable. Kidney function similar to last year. No changes

## 2018-12-26 ENCOUNTER — Telehealth: Payer: Self-pay

## 2018-12-26 NOTE — Telephone Encounter (Signed)
Called and lmomed the pt 2nd attempt to reach him in regards to starting repatha he has gotten a low income subsidy denial letter and misplaced it and I need that letterto reapply for repatha. Instructed the pt to call us back ASAP.

## 2018-12-31 ENCOUNTER — Telehealth: Payer: Self-pay

## 2018-12-31 NOTE — Telephone Encounter (Signed)
Called and spoke w/pt about the importance of obtaining that Low income subsidy denial letter and instructed him to contact them and get a copy and bring it to [place] so that we may apply for [place]. The pt voiced understanding but stated that he has been too busy to obtain that letter

## 2019-01-11 NOTE — Telephone Encounter (Signed)
We completed same process last years and only thing missing was LIS for [employer] approval.  He understands [facility] will provide Repatha free of charge for 1 year but was not interested at the time.  * Will close this encounter and re-activate if LIS letter received*

## 2019-05-07 ENCOUNTER — Telehealth: Payer: Self-pay

## 2019-05-07 NOTE — Telephone Encounter (Signed)
Virtual Visit Pre-Appointment Phone Call  "(Name), I am calling you today to discuss your upcoming appointment. We are currently trying to limit exposure to the virus that causes COVID-19 by seeing patients at home rather than in the office."  1. "What is the BEST phone number to call the day of the visit?" - include this in appointment notes  2. "Do you have or have access to (through a family member/friend) a smartphone with video capability that we can use for your visit?" a. If yes - list this number in appt notes as "cell" (if different from BEST phone #) and list the appointment type as a VIDEO visit in appointment notes b. If no - list the appointment type as a PHONE visit in appointment notes  Confirm consent - "In the setting of the current Covid19 crisis, you are scheduled for a (phone or video) visit with your provider on (date) at (time).  Just as we do with many in-office visits, in order for you to participate in this visit, we must obtain consent.  If you'd like, I can send this to your mychart (if signed up) or email for you to review.  Otherwise, I can obtain your verbal consent now.  All virtual visits are billed to your insurance company just like a normal visit would be.  By agreeing to a virtual visit, we'd like you to understand that the technology does not allow for your provider to perform an examination, and thus may limit your provider's ability to fully assess your condition. If your provider identifies any concerns that need to be evaluated in person, we will make arrangements to do so.  Finally, though the technology is pretty good, we cannot assure that it will always work on either your or our end, and in the setting of a video visit, we may have to convert it to a phone-only visit.  In either situation, we cannot ensure that we have a secure connection.  Are you willing to proceed?" STAFF: Did the patient verbally acknowledge consent to telehealth visit? Document  YES/NO here:  3. Advise patient to be prepared - "Two hours prior to your appointment, go ahead and check your blood pressure, pulse, oxygen saturation, and your weight (if you have the equipment to check those) and write them all down. When your visit starts, your provider will ask you for this information. If you have an Apple Watch or Kardia device, please plan to have heart rate information ready on the day of your appointment. Please have a pen and paper handy nearby the day of the visit as well."  4. Give patient instructions for MyChart download to smartphone OR [email or web address].me as below if video visit (depending on what platform provider is using)  5. Inform patient they will receive a phone call 15 minutes prior to their appointment time (may be from unknown caller ID) so they should be prepared to answer    [place] has been deemed a candidate for a follow-up tele-health visit to limit community exposure during the Covid-19 pandemic. I spoke with the patient via phone to ensure availability of phone/video source, confirm preferred email & phone number, and discuss instructions and expectations.  I reminded ALEXS BIFANO to be prepared with any vital sign and/or heart rhythm information that could potentially be obtained via home monitoring, at the time of his visit. I reminded Manuel Nguyen to expect a phone call prior to his visit.  Dorothey Baseman 05/07/2019 4:53 PM   INSTRUCTIONS FOR DOWNLOADING THE MYCHART APP TO SMARTPHONE  - The patient must first make sure to have activated MyChart and know their login information - If Apple, go to [employer] and type in MyChart in the search bar and download the app. If Android, ask patient to go to [employer] and type in [place] in the search bar and download the app. The app is free but as with any other app downloads, their phone may require them to verify saved payment information or Apple/Android  password.  - The patient will need to then log into the app with their MyChart username and password, and select [facility] as their healthcare provider to link the account. When it is time for your visit, go to the MyChart app, find appointments, and click Begin Video Visit. Be sure to Select Allow for your device to access the Microphone and Camera for your visit. You will then be connected, and your provider will be with you shortly.  **If they have any issues connecting, or need assistance please contact MyChart service desk [phone])**  **If using a computer, in order to ensure the best quality for their visit they will need to use either of the following Internet Browsers: [employer], or Google Chrome**  IF USING DOXIMITY or DOXY.ME - The patient will receive a link just prior to their visit by text.     FULL LENGTH CONSENT FOR TELE-HEALTH VISIT   I hereby voluntarily request, consent and authorize [facility] and its employed or contracted physicians, physician assistants, nurse practitioners or other licensed health care professionals (the Practitioner), to provide me with telemedicine health care services (the "Services") as deemed necessary by the treating Practitioner. I acknowledge and consent to receive the Services by the Practitioner via telemedicine. I understand that the telemedicine visit will involve communicating with the Practitioner through live audiovisual communication technology and the disclosure of certain medical information by electronic transmission. I acknowledge that I have been given the opportunity to request an in-person assessment or other available alternative prior to the telemedicine visit and am voluntarily participating in the telemedicine visit.  I understand that I have the right to withhold or withdraw my consent to the use of telemedicine in the course of my care at any time, without affecting my right to future care or treatment,  and that the Practitioner or I may terminate the telemedicine visit at any time. I understand that I have the right to inspect all information obtained and/or recorded in the course of the telemedicine visit and may receive copies of available information for a reasonable fee.  I understand that some of the potential risks of receiving the Services via telemedicine include:  Marland Kitchen Delay or interruption in medical evaluation due to technological equipment failure or disruption; . Information transmitted may not be sufficient (e.g. poor resolution of images) to allow for appropriate medical decision making by the Practitioner; and/or  . In rare instances, security protocols could fail, causing a breach of personal health information.  Furthermore, I acknowledge that it is my responsibility to provide information about my medical history, conditions and care that is complete and accurate to the best of my ability. I acknowledge that Practitioner's advice, recommendations, and/or decision may be based on factors not within their control, such as incomplete or inaccurate data provided by me or distortions of diagnostic images or specimens that may result from electronic transmissions. I understand that the practice  of medicine is not an Chief Strategy Officer and that Practitioner makes no warranties or guarantees regarding treatment outcomes. I acknowledge that I will receive a copy of this consent concurrently upon execution via email to the email address I last provided but may also request a printed copy by calling the office of [facility].    I understand that my insurance will be billed for this visit.   I have read or had this consent read to me. . I understand the contents of this consent, which adequately explains the benefits and risks of the Services being provided via telemedicine.  . I have been provided ample opportunity to ask questions regarding this consent and the Services and have had my questions  answered to my satisfaction. . I give my informed consent for the services to be provided through the use of telemedicine in my medical care  By participating in this telemedicine visit I agree to the above.

## 2019-06-11 ENCOUNTER — Telehealth (INDEPENDENT_AMBULATORY_CARE_PROVIDER_SITE_OTHER): Payer: Medicare HMO | Admitting: Cardiovascular Disease

## 2019-06-11 ENCOUNTER — Encounter: Payer: Self-pay | Admitting: Cardiovascular Disease

## 2019-06-11 VITALS — Ht 69.0 in | Wt 219.0 lb

## 2019-06-11 DIAGNOSIS — I25118 Atherosclerotic heart disease of native coronary artery with other forms of angina pectoris: Secondary | ICD-10-CM

## 2019-06-11 DIAGNOSIS — I10 Essential (primary) hypertension: Secondary | ICD-10-CM

## 2019-06-11 DIAGNOSIS — N183 Chronic kidney disease, stage 3 unspecified: Secondary | ICD-10-CM

## 2019-06-11 DIAGNOSIS — Z9114 Patient's other noncompliance with medication regimen: Secondary | ICD-10-CM

## 2019-06-11 DIAGNOSIS — Z955 Presence of coronary angioplasty implant and graft: Secondary | ICD-10-CM

## 2019-06-11 DIAGNOSIS — E785 Hyperlipidemia, unspecified: Secondary | ICD-10-CM

## 2019-06-11 DIAGNOSIS — Z789 Other specified health status: Secondary | ICD-10-CM

## 2019-06-11 NOTE — Addendum Note (Signed)
Addended by: Barbarann Ehlers A on: 06/11/2019 12:25 PM   Modules accepted: Orders

## 2019-06-11 NOTE — Patient Instructions (Signed)
Medication Instructions:  Your physician recommends that you continue on your current medications as directed. Please refer to the Current Medication list given to you today.  *If you need a refill on your cardiac medications before your next appointment, please call your pharmacy*   Lab Work: None today If you have labs (blood work) drawn today and your tests are completely normal, you will receive your results only by: Marland Kitchen MyChart Message (if you have MyChart) OR . A paper copy in the mail If you have any lab test that is abnormal or we need to change your treatment, we will call you to review the results.   Testing/Procedures: None today   Follow-Up: At [facility], you and your health needs are our priority.  As part of our continuing mission to provide you with exceptional heart care, we have created designated Provider Care Teams.  These Care Teams include your primary Cardiologist (physician) and Advanced Practice Providers (APPs -  Physician Assistants and Nurse Practitioners) who all work together to provide you with the care you need, when you need it.  We recommend signing up for the patient portal called "MyChart".  Sign up information is provided on this After Visit Summary.  MyChart is used to connect with patients for Virtual Visits (Telemedicine).  Patients are able to view lab/test results, encounter notes, upcoming appointments, etc.  Non-urgent messages can be sent to your provider as well.   To learn more about what you can do with MyChart, go to [email or web address].    Your next appointment:   6 month(s)  The format for your next appointment:   In Person  Provider:   Bernerd Pho, PA-C   Other Instructions We have referred you to the [facility]. Someone from the [place] office will call you.       Thank you for choosing [facility] !

## 2019-06-11 NOTE — Progress Notes (Signed)
Virtual Visit via Telephone Note   This visit type was conducted due to national recommendations for restrictions regarding the COVID-19 Pandemic (e.g. social distancing) in an effort to limit this patient's exposure and mitigate transmission in our community.  Due to his co-morbid illnesses, this patient is at least at moderate risk for complications without adequate follow up.  This format is felt to be most appropriate for this patient at this time.  The patient did not have access to video technology/had technical difficulties with video requiring transitioning to audio format only (telephone).  All issues noted in this document were discussed and addressed.  No physical exam could be performed with this format.  Please refer to the patient's chart for his  consent to telehealth for [facility].   The patient was identified using 2 identifiers.  Date:  06/11/2019   ID:  Manuel Nguyen, DOB 01/30/1951, MRN [id number]  Patient Location: Home Provider Location: Office  PCP:  [name], NP  Cardiologist:  Kate Sable, MD  Electrophysiologist:  None   Evaluation Performed:  Follow-Up Visit  Chief Complaint:  CAD  History of Present Illness:    Manuel Nguyen is a 69 y.o. male with history of an inferolateral STEMI in May 2013, for which he underwent drug-eluting stent placement to the first obtuse marginal branch (deemed to be culprit vessel) and proximal RCA. 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  He has been intolerant to both Crestor and pravastatin in the past, as it led to myalgias.  He is known to be noncompliant with both medications and dietary recommendations.  He denies chest pain, leg swelling, orthopnea, palpitations.  We talked about his previous refusal of Repatha but he denies ever having said that.   Past Medical History:  Diagnosis Date  . Coronary artery disease 08/2011    With drug-eluting stent to right coronary artery and obtuse marginal vessel  . Hyperlipemia   . Hypertension    Past Surgical History:  Procedure Laterality Date  . CARDIAC CATHETERIZATION     Drug-eluting stent to proximal right coronary artery and first acute marginal vessel  . COLONOSCOPY WITH PROPOFOL N/A 01/18/2018   Procedure: COLONOSCOPY WITH PROPOFOL;  Surgeon: Daneil Dolin, MD;  [facility];  Service: Endoscopy;  Laterality: N/A;  10:15am  . LEFT HEART CATHETERIZATION WITH CORONARY ANGIOGRAM N/A 08/16/2011   Procedure: LEFT HEART CATHETERIZATION WITH CORONARY ANGIOGRAM;  Surgeon: Peter M Martinique, MD;  Location: [facility] CATH LAB;  Service: Cardiovascular;  Laterality: N/A;  . POLYPECTOMY  01/18/2018   Procedure: POLYPECTOMY;  Surgeon: Daneil Dolin, MD;  [facility];  Service: Endoscopy;;  colon     Current Meds  Medication Sig  . aspirin EC 81 MG tablet Take 1 tablet (81 mg total) by mouth daily. (Patient taking differently: Take 81 mg by mouth daily as needed. )  . ezetimibe (ZETIA) 10 MG tablet Take 1 tablet (10 mg total) by mouth daily.  . metoprolol (LOPRESSOR) 50 MG tablet Take 1 tablet (50 mg total) by mouth 2 (two) times daily. (Patient taking differently: Take 50 mg by mouth daily. )  . valsartan (DIOVAN) 160 MG tablet Take 160 mg by mouth daily.     Allergies:   Patient has no known allergies.   Social History   Tobacco Use  . Smoking status: Current Some Day Smoker    Types:  Cigarettes, Cigars    Start date: 12/21/1964    Last attempt to quit: 08/15/1985    Years since quitting: 33.8  . Smokeless tobacco: Former Systems developer    Types: [place] date: 11/26/1993  . Tobacco comment: Cigar once a week (as of 12/14/17)  Substance Use Topics  . Alcohol use: Yes    Alcohol/week: 0.0 standard drinks    Comment: Small drink once a week (weekend) as of 12/14/17  . Drug use: No     Family Hx: The patient's family history includes Diabetes in his  brother and sister; Hyperlipidemia in his brother and sister; Hypertension in his brother and sister. There is no history of Colon cancer.  ROS:   Please see the history of present illness.     All other systems reviewed and are negative.   Prior CV studies:   The following studies were reviewed today:  Echocardiogram 08/17/2011 Study Conclusions   - Left ventricle: Systolic function was normal. The  estimated ejection fraction was in the range of 55% to  60%. Basal posterior and basal anterolateral hypokinesis.  Features are consistent with a pseudonormal left  ventricular filling pattern, with concomitant abnormal  relaxation and increased filling pressure (grade 2  diastolic dysfunction).  - Aortic valve: There was no stenosis.  - Mitral valve: Mildly calcified annulus. Trivial  regurgitation.  - Left atrium: The atrium was mildly dilated.  - Right ventricle: The cavity size was normal. Systolic  function was normal.  - Pulmonary arteries: No complete TR doppler jet so unable  to estimate PA systolic pressure.  - Systemic veins: IVC measured 2.0 cm with normal  respirophasic variation, suggesting RA pressure 6-10 mmHg.  Impressions:   - Normal LV size and systolic function, EF 0000000. Basal  posterior and basal anterolateral hypokinesis. Moderate  diastolic dysfunction. Normal RV size and systolic  function. No significant valvular dysfunction.   Cardiac Cath: 08/16/2011 Left mainstem: Normal.  Left anterior descending (LAD): There are moderate irregularities in the mid to distal vessel up to 20%.  Left circumflex (LCx): There is a 40% stenosis at the ostium of the left circumflex. The first obtuse marginal vessel is occluded. The second marginal branch has irregularities up to 20%. The left circumflex then continues in the AV groove to the distal posterior lateral area. There are mild irregularities in the distal vessel as well.  Right coronary  artery (RCA): The right coronary is a dominant vessel. There is an 80-90% focal stenosis in the proximal vessel. In the mid vessel after the takeoff of the right ventricular marginal branch there is total occlusion. There are left-to-right collaterals to the far distal right coronary.  Left ventriculography: Left ventricular systolic function abnormal. There is mid inferior wall hypokinesis. Overall systolic function is well preserved with an EF of 55%. PCI Data:  Vessel - RCA/Segment - proximal  Percent Stenosis (pre) 80-90%  TIMI-flow 3  Stent 3.0 x 12 mm Promus element  Percent Stenosis (post) 0%  TIMI-flow (post) 3  Vessel #2-RCA/segment mid  Percent stenosis (pre-) 100%  TIMI flow 0  Unable to cross with a wire. Vessel appears to be a chronic total occlusion.  Vessel #3-obtuse marginal vessel  Percent stenosis (pre-) 100%  TIMI flow 0  Stent 3.0 x 28 mm Promus element  Percent stenosis (post) 0%  TIMI flow (post) 3  Labs/Other Tests and Data Reviewed:    EKG:  No ECG reviewed.  Recent Labs: 12/18/2018: ALT 24; BUN  20; Creatinine, Ser 1.65; Hemoglobin 13.4; Platelets 249; Potassium 4.1; Sodium 136; TSH 1.514   Recent Lipid Panel Lab Results  Component Value Date/Time   CHOL 300 (H) 08/17/2011 09:54 AM   TRIG 171 (H) 08/17/2011 09:54 AM   HDL 40 08/17/2011 09:54 AM   CHOLHDL 7.5 08/17/2011 09:54 AM   LDLCALC 226 (H) 08/17/2011 09:54 AM    Wt Readings from Last 3 Encounters:  06/11/19 219 lb (99.3 kg)  12/18/18 227 lb (103 kg)  01/31/18 220 lb (99.8 kg)     Objective:    Vital Signs:  Ht 5\' 9"  (1.753 m)   Wt 219 lb (99.3 kg)   BMI 32.34 kg/m    VITAL SIGNS:  reviewed  ASSESSMENT & PLAN:    1.  Coronary artery disease:  He has a history of an inferolateral STEMI in May 2013, for which he underwent drug-eluting stent placement to the first obtuse marginal branch (deemed to be culprit vessel) and proximal RCA. 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 Symptomatically stable.  Continue aspirin and beta-blocker.  Statin intolerant.  2.  Hypertension: No change to therapy.  He does not know where his blood pressure cuff is.  3.  Hyperlipidemia: Statin intolerant.  Did not want Repatha in spite of the fact that the pharmaceutical company would provide it for 1 year free of charge (although he claims having not ever said that).  He remains on Zetia.  Lipid panel on 04/02/2019-total cholesterol 289, triglycerides 182, HDL 43, LDL 210.  We have had multiple discussions regarding low-dose and decreased frequency of statin therapy in the past.  I will make a referral to the lipid clinic.  4.  Chronic kidney disease stage III: Creatinine 1.84 on 04/02/2019.     COVID-19 Education: The signs and symptoms of COVID-19 were discussed with the patient and how to seek care for testing (follow up with PCP or arrange E-visit).  The importance of social distancing was discussed today.  Time:   Today, I have spent 20 minutes with the patient with telehealth technology discussing the above problems.     Medication Adjustments/Labs and Tests Ordered: Current medicines are reviewed at length with the patient today.  Concerns regarding medicines are outlined above.   Tests Ordered: No orders of the defined types were placed in this encounter.   Medication Changes: No orders of the defined types were placed in this encounter.   Follow Up:  In Person in 6 month(s)  Signed, Kate Sable, MD  06/11/2019 12:06 PM    [place]

## 2019-07-04 ENCOUNTER — Ambulatory Visit: Payer: Medicare HMO

## 2019-07-04 ENCOUNTER — Telehealth: Payer: Self-pay | Admitting: Pharmacist

## 2019-07-04 MED ORDER — REPATHA SURECLICK 140 MG/ML ~~LOC~~ SOAJ: 140.0000 mg | SUBCUTANEOUS | 11 refills | Status: DC

## 2019-07-04 NOTE — Telephone Encounter (Signed)
Patient still interested in Repatha/Praluent.  Will submit new PA for Repatha today and call patient to complete [employer] information if needed.

## 2019-07-04 NOTE — Addendum Note (Signed)
Addended by: Allean Found on: 07/04/2019 08:58 AM   Modules accepted: Orders

## 2019-07-04 NOTE — Telephone Encounter (Signed)
Called the pt to get the repatha started lmomed the pt that the rx was sent and instructed the pt to call us back if it is unaffordable so that we can apply for the [employer] foundation

## 2019-07-05 ENCOUNTER — Telehealth: Payer: Self-pay | Admitting: Pharmacist

## 2019-07-05 NOTE — Telephone Encounter (Signed)
Patient returning call for [facility]. Please call him back.

## 2019-07-08 NOTE — Telephone Encounter (Signed)
Pt called back and we were able to get him a grant for the [employer] foundation and scheduled an appt time to the first injection demo. An email of the [employer] card was sent to the pt including instructions to bring the med with him and the address. Pt was instructed to contact us if issues arise.

## 2019-07-16 ENCOUNTER — Encounter: Payer: Self-pay | Admitting: Urology

## 2019-07-16 ENCOUNTER — Ambulatory Visit: Payer: Medicare HMO | Admitting: Urology

## 2019-07-16 ENCOUNTER — Other Ambulatory Visit: Payer: Self-pay

## 2019-07-16 VITALS — BP 139/87 | HR 84 | Temp 98.1°F | Ht 69.0 in | Wt 220.0 lb

## 2019-07-16 DIAGNOSIS — R972 Elevated prostate specific antigen [PSA]: Secondary | ICD-10-CM

## 2019-07-16 LAB — POCT URINALYSIS DIPSTICK
Bilirubin, UA: NEGATIVE
Blood, UA: NEGATIVE
Glucose, UA: NEGATIVE
Ketones, UA: NEGATIVE
Leukocytes, UA: NEGATIVE
Nitrite, UA: NEGATIVE
Protein, UA: NEGATIVE
Spec Grav, UA: 1.01 (ref 1.010–1.025)
Urobilinogen, UA: NEGATIVE E.U./dL — AB
pH, UA: 7 (ref 5.0–8.0)

## 2019-07-16 LAB — PSA: PSA: 4.4 ng/mL — ABNORMAL HIGH (ref <=4.0)

## 2019-07-16 NOTE — Progress Notes (Signed)
H&P  Chief Complaint: Elevated PSA  History of Present Illness:   4.20.2021: He returns today for follow-up. No significant urinary complaints but he does c/o difficulties maintaining erection. He has never been on oral medications for ED but is hesitant to try because he heard through local gossip that it is what killed one of his older friends.   IPSS Questionnaire (AUA-7): Over the past month.   1)  How often have you had a sensation of not emptying your bladder completely after you finish urinating?  1 - Less than 1 time in 5  2)  How often have you had to urinate again less than two hours after you finished urinating? 0 - Not at all  3)  How often have you found you stopped and started again several times when you urinated?  0 - Not at all  4) How difficult have you found it to postpone urination?  0 - Not at all  5) How often have you had a weak urinary stream?  1 - Less than 1 time in 5  6) How often have you had to push or strain to begin urination?  0 - Not at all  7) How many times did you most typically get up to urinate from the time you went to bed until the time you got up in the morning?  1 - 1 time  Total score:  0-7 mildly symptomatic   8-19 moderately symptomatic   20-35 severely symptomatic   Total: 3 QoL: 1  (below copied from [facility] records):  Elevated PSA:   His PSA is 4.8. He has not had PSA's drawn prior to this one. He has not had a prostate nodule on a physical examination. He has not had recurrent prostate infections or chronic prostatitis.   He has not been on antibiotics for prostate infections previously. He has not had a prostate biopsy done.   Family members with diagnosed prostate cancer include his brother.   Healthy 69 year old male sent by Dr. Barry Dienes [place] PSA. His PSA was checked in October of last year was 4.8. By patient's history it was recheck last week and was 4.4. He does have a family history of prostate cancer--his  brother was apparently treated for this.   Past Medical History:  Diagnosis Date  . Coronary artery disease 08/2011   With drug-eluting stent to right coronary artery and obtuse marginal vessel  . Hyperlipemia   . Hypertension     Past Surgical History:  Procedure Laterality Date  . CARDIAC CATHETERIZATION     Drug-eluting stent to proximal right coronary artery and first acute marginal vessel  . COLONOSCOPY WITH PROPOFOL N/A 01/18/2018   Procedure: COLONOSCOPY WITH PROPOFOL;  Surgeon: Daneil Dolin, MD;  [facility];  Service: Endoscopy;  Laterality: N/A;  10:15am  . LEFT HEART CATHETERIZATION WITH CORONARY ANGIOGRAM N/A 08/16/2011   Procedure: LEFT HEART CATHETERIZATION WITH CORONARY ANGIOGRAM;  Surgeon: Peter M Martinique, MD;  Location: [facility] CATH LAB;  Service: Cardiovascular;  Laterality: N/A;  . POLYPECTOMY  01/18/2018   Procedure: POLYPECTOMY;  Surgeon: Daneil Dolin, MD;  [facility];  Service: Endoscopy;;  colon    Home Medications:  Allergies as of 07/16/2019   No Known Allergies     Medication List       Accurate as of July 16, 2019  1:59 PM. If you have any questions, ask your nurse or doctor.  aspirin EC 81 MG tablet Take 1 tablet (81 mg total) by mouth daily. What changed:   when to take this  reasons to take this   ezetimibe 10 MG tablet Commonly known as: ZETIA Take 1 tablet (10 mg total) by mouth daily.   metoprolol tartrate 50 MG tablet Commonly known as: LOPRESSOR Take 1 tablet (50 mg total) by mouth 2 (two) times daily. What changed: when to take this   Repatha SureClick [id number] MG/ML Soaj Generic drug: Evolocumab Inject 140 mg into the skin every 14 (fourteen) days.   valsartan 160 MG tablet Commonly known as: DIOVAN Take 160 mg by mouth daily.       Allergies: No Known Allergies  Family History  Problem Relation Age of Onset  . Diabetes Brother   . Diabetes Sister   . Hypertension Brother   .  Hypertension Sister   . Hyperlipidemia Sister   . Hyperlipidemia Brother   . Colon cancer Neg Hx     Social History:  reports that he has been smoking cigarettes and cigars. He started smoking about 54 years ago. He quit smokeless tobacco use about 25 years ago.  His smokeless tobacco use included chew. He reports current alcohol use. He reports that he does not use drugs.  Urological Symptom Review  Patient is experiencing the following symptoms: None  Review of Systems  Gastrointestinal (upper)  : Negative for upper GI symptoms  Gastrointestinal (lower) : Negative for lower GI symptoms  Constitutional : Negative for symptoms  Skin: Negative for skin symptoms  Eyes: Negative for eye symptoms  Ear/Nose/Throat : Negative for Ear/Nose/Throat symptoms  Hematologic/Lymphatic: Negative for Hematologic/Lymphatic symptoms  Cardiovascular : Negative for cardiovascular symptoms  Respiratory : Negative for respiratory symptoms  Endocrine: Negative for endocrine symptoms  Musculoskeletal: Negative for musculoskeletal symptoms  Neurological: Negative for neurological symptoms  Psychologic: Negative for psychiatric symptoms           Physical Exam:  Vital signs in last 24 hours: BP 139/87   Pulse 84   Temp 98.1 F (36.7 C)   Ht 5\' 9"  (1.753 m)   Wt 220 lb (99.8 kg)   BMI 32.49 kg/m  Constitutional:  Alert and oriented, No acute distress Cardiovascular: Regular rate  Respiratory: Normal respiratory effort GI: Abdomen is soft, nontender, nondistended, no abdominal masses. No CVAT. No hernias.  Genitourinary: Normal male phallus (uncircumcised), testes are descended bilaterally and non-tender and without masses, scrotum is normal in appearance without lesions or masses, perineum is normal on inspection. Prostate feels around 50 grams in size.  Lymphatic: No lymphadenopathy Neurologic: Grossly intact, no focal deficits Psychiatric: Normal mood and  affect  Laboratory Data:  No results for input(s): WBC, HGB, HCT, PLT in the last 72 hours.  No results for input(s): NA, K, CL, GLUCOSE, BUN, CALCIUM, CREATININE in the last 72 hours.  Invalid input(s): CO3   Results for orders placed or performed in visit on 07/16/19 (from the past 24 hour(s))  POCT urinalysis dipstick     Status: Abnormal   Collection Time: 07/16/19  1:50 PM  Result Value Ref Range   Color, UA lt yellow    Clarity, UA clear    Glucose, UA Negative Negative   Bilirubin, UA neg    Ketones, UA neg    Spec Grav, UA 1.010 1.010 - 1.025   Blood, UA neg    pH, UA 7.0 5.0 - 8.0   Protein, UA Negative Negative   Urobilinogen, UA negative (A)  0.2 or 1.0 E.U./dL   Nitrite, UA neg    Leukocytes, UA Negative Negative   Appearance clear    Odor     No results found for this or any previous visit (from the past 240 hour(s)).  I have reviewed prior pt notes  I have reviewed notes from referring/previous physicians  I have reviewed urinalysis results  I have reviewed prior PSA results Impression/Assessment:  No major voiding complaints. Will recheck PSA today. If this is elevated, will schedule prostate MRI to move forward with screening. DRE normal -- this was difficult to complete at last visit but was performed without much difficulty today.   He would like to hold off on initiating medical therapy for his ED.  Plan:  1. PSA today. If elevated consider TRUS/Bx  2. Pending result of recheck, will plan appropriate follow-up  3. Default OV in 1 yr w/ PSA

## 2019-07-16 NOTE — Progress Notes (Signed)

## 2019-07-23 ENCOUNTER — Other Ambulatory Visit: Payer: Self-pay | Admitting: Urology

## 2019-07-23 DIAGNOSIS — R972 Elevated prostate specific antigen [PSA]: Secondary | ICD-10-CM

## 2019-07-23 MED ORDER — LEVOFLOXACIN 750 MG PO TABS: 750.0000 mg | ORAL_TABLET | Freq: Every day | ORAL | 0 refills | Status: AC

## 2019-07-24 ENCOUNTER — Telehealth: Payer: Self-pay

## 2019-07-24 NOTE — Telephone Encounter (Signed)
Pt called and made aware. Pt wishes to hold off on biopsy at this time. MD notified.

## 2019-07-24 NOTE — Telephone Encounter (Signed)
-----   Message from [name], MD sent at 07/23/2019  3:01 PM EDT ----- Notify pt--PSA still elevated  I suggest TRUS/Bx--orders put in. Please schedule ----- Message ----- From: Dorisann Frames, RN Sent: 07/17/2019   9:24 AM EDT To: [name], MD  Please review

## 2019-07-30 NOTE — Telephone Encounter (Signed)
Make sure that pt comes back in 6 mos for PSA/DRE then if he does not want to do the bx

## 2019-09-03 ENCOUNTER — Telehealth: Payer: Self-pay | Admitting: Cardiovascular Disease

## 2019-09-03 NOTE — Telephone Encounter (Signed)
Pt called stating he has an Aneurysm in his stomach, was told to let his cardiologist know.

## 2019-09-03 NOTE — Telephone Encounter (Signed)
Attempt to reach pt to get more info, line busy. He could not tell fron office staff where or why he had test.

## 2019-09-03 NOTE — Telephone Encounter (Signed)
I will [facility] cardiologist

## 2019-09-04 NOTE — Telephone Encounter (Signed)
So he does not recall where the test was done to get the results or who told him to let us know? He is over 50 with smoking history so would qualify for a AAA screening [place] but hate to redo a test that may have already been done   Zandra Abts MD

## 2019-09-09 NOTE — Telephone Encounter (Signed)
I found that patient goes to [facility] in [place]. I spoke with "Stanton Kidney" at the front desk.She will leave message for the nurse to call me and fax [place] report to [place].

## 2019-09-10 NOTE — Telephone Encounter (Signed)
[place] report received from [facility] neph[facility], faxed to [place] in [place]

## 2019-12-15 ENCOUNTER — Emergency Department (HOSPITAL_COMMUNITY)
Admission: EM | Admit: 2019-12-15 | Discharge: 2019-12-15 | Disposition: A | Discharge status: Home / Self Care | Payer: Medicare HMO | Attending: Emergency Medicine | Admitting: Emergency Medicine

## 2019-12-15 ENCOUNTER — Other Ambulatory Visit: Payer: Self-pay

## 2019-12-15 ENCOUNTER — Emergency Department (HOSPITAL_COMMUNITY): Payer: Medicare HMO

## 2019-12-15 ENCOUNTER — Encounter (HOSPITAL_COMMUNITY): Payer: Self-pay | Admitting: *Deleted

## 2019-12-15 DIAGNOSIS — F17290 Nicotine dependence, other tobacco product, uncomplicated: Secondary | ICD-10-CM | POA: Insufficient documentation

## 2019-12-15 DIAGNOSIS — I1310 Hypertensive heart and chronic kidney disease without heart failure, with stage 1 through stage 4 chronic kidney disease, or unspecified chronic kidney disease: Secondary | ICD-10-CM | POA: Diagnosis not present

## 2019-12-15 DIAGNOSIS — F17210 Nicotine dependence, cigarettes, uncomplicated: Secondary | ICD-10-CM | POA: Insufficient documentation

## 2019-12-15 DIAGNOSIS — Z7982 Long term (current) use of aspirin: Secondary | ICD-10-CM | POA: Diagnosis not present

## 2019-12-15 DIAGNOSIS — Z79899 Other long term (current) drug therapy: Secondary | ICD-10-CM | POA: Diagnosis not present

## 2019-12-15 DIAGNOSIS — E119 Type 2 diabetes mellitus without complications: Secondary | ICD-10-CM | POA: Diagnosis not present

## 2019-12-15 DIAGNOSIS — M79662 Pain in left lower leg: Secondary | ICD-10-CM

## 2019-12-15 DIAGNOSIS — N183 Chronic kidney disease, stage 3 unspecified: Secondary | ICD-10-CM | POA: Insufficient documentation

## 2019-12-15 DIAGNOSIS — M79605 Pain in left leg: Secondary | ICD-10-CM | POA: Diagnosis not present

## 2019-12-15 MED ORDER — LIDOCAINE 5 % EX PTCH
1.0000 | MEDICATED_PATCH | CUTANEOUS | Status: DC
  Filled 2019-12-15: qty 1

## 2019-12-15 NOTE — Discharge Instructions (Signed)
You have been seen here for left calf pain.  Lab work and imaging all look reassuring.  I recommend taking over-the-counter pain medications like ibuprofen or Tylenol every 6 hours needed for pain.  Please follow dosing  on the back of bottle.  Also recommend applying ice to the area as this can help decrease swelling and inflammation.  I want you to follow-up with your primary care provider if symptoms persist after 2 weeks or if symptoms worsen.  I want you to come back to the emergency department if you develop chest pain, shortness of breath, severe abdominal pain, nausea, vomiting, diarrhea as these symptoms require further evaluation management.

## 2019-12-15 NOTE — ED Triage Notes (Signed)
Pt with left lower leg pain for past 3 days. Denies any injury.  Pt able to put weight on his leg.

## 2019-12-15 NOTE — ED Provider Notes (Addendum)
[facility] EMERGENCY DEPARTMENT Provider Note   CSN: [id number] Arrival date & time: 12/15/19  1116     History Chief Complaint  Patient presents with  . Leg Pain    Manuel Nguyen is a 69 y.o. male.  HPI   Patient with significant medical history of CAD, hyperlipidemia, hypertension presents to the emergency department with chief complaint of left calf and knee pain that started on Wednesday.  Patient states he has had increasing pain.  Patient states the pain is a sharp sensation which he feels sometimes when he is walking or when he is laying in bed.  He states he feels generally worse at night and intends to feel better after he moves it around.  He denies any recent trauma to the area, but does endorse recent trip to [place] which is about a 4-hour trip.  He has no history of DVTs, PEs, is not on testosterone, denies chest pain, shortness of breath, increasing pedal edema.  He has been taking Tylenol without any relief.  He is never experienced this pain before.  Patient denies headache, fever, chills, shortness of breath, chest pain, vomiting, nausea, vomiting, diarrhea, pedal edema.  Past Medical History:  Diagnosis Date  . Coronary artery disease 08/2011   With drug-eluting stent to right coronary artery and obtuse marginal vessel  . Hyperlipemia   . Hypertension     Patient Active Problem List   Diagnosis Date Noted  . CAD (coronary artery disease) 12/17/2018  . CKD (chronic kidney disease), stage III 12/17/2018  . Encounter for screening colonoscopy 12/14/2017  . Diabetes mellitus ([place]) 08/20/2011  . Hyperlipidemia 08/17/2011  . NSVT (nonsustained ventricular tachycardia) ([place]) 08/17/2011  . Blood glucose abnormal 08/17/2011  . STEMI (ST elevation myocardial infarction) ([place]) 08/16/2011  . HTN (hypertension) 08/16/2011    Past Surgical History:  Procedure Laterality Date  . CARDIAC CATHETERIZATION     Drug-eluting stent to proximal right coronary artery and  first acute marginal vessel  . COLONOSCOPY WITH PROPOFOL N/A 01/18/2018   Procedure: COLONOSCOPY WITH PROPOFOL;  Surgeon: Daneil Dolin, MD;  [facility];  Service: Endoscopy;  Laterality: N/A;  10:15am  . LEFT HEART CATHETERIZATION WITH CORONARY ANGIOGRAM N/A 08/16/2011   Procedure: LEFT HEART CATHETERIZATION WITH CORONARY ANGIOGRAM;  Surgeon: Peter M Martinique, MD;  Location: [facility] CATH LAB;  Service: Cardiovascular;  Laterality: N/A;  . POLYPECTOMY  01/18/2018   Procedure: POLYPECTOMY;  Surgeon: Daneil Dolin, MD;  Location: [facility] ENDO SUITE;  Service: Endoscopy;;  colon       Family History  Problem Relation Age of Onset  . Diabetes Brother   . Diabetes Sister   . Hypertension Brother   . Hypertension Sister   . Hyperlipidemia Sister   . Hyperlipidemia Brother   . Colon cancer Neg Hx     Social History   Tobacco Use  . Smoking status: Current Some Day Smoker    Types: Cigarettes, Cigars    Start date: 12/21/1964    Last attempt to quit: 08/15/1985    Years since quitting: 34.3  . Smokeless tobacco: Former Systems developer    Types: [place] date: 11/26/1993  . Tobacco comment: Cigar once a week (as of 12/14/17)  Vaping Use  . Vaping Use: Never used  Substance Use Topics  . Alcohol use: Yes    Alcohol/week: 0.0 standard drinks    Comment: Small drink once a week (weekend) as of 12/14/17  . Drug use: No  Home Medications Prior to Admission medications   Medication Sig Start Date End Date Taking? Authorizing Provider  aspirin EC 81 MG tablet Take 1 tablet (81 mg total) by mouth daily. Patient taking differently: Take 81 mg by mouth daily as needed.  08/03/17   Herminio Commons, MD  Evolocumab (REPATHA SURECLICK) 614 MG/ML SOAJ Inject 140 mg into the skin every 14 (fourteen) days. 07/04/19   Herminio Commons, MD  ezetimibe (ZETIA) 10 MG tablet Take 1 tablet (10 mg total) by mouth daily. 12/18/18   Imogene Burn, PA-C  metoprolol (LOPRESSOR) 50 MG tablet Take 1 tablet  (50 mg total) by mouth 2 (two) times daily. Patient taking differently: Take 50 mg by mouth daily.  02/26/15   Arnoldo Lenis, MD  valsartan (DIOVAN) 160 MG tablet Take 160 mg by mouth daily.    [provider]    Allergies    Patient has no known allergies.  Review of Systems   Review of Systems  Constitutional: Negative for chills and fever.  HENT: Negative for congestion, tinnitus, trouble swallowing and voice change.   Respiratory: Negative for cough and shortness of breath.   Cardiovascular: Negative for chest pain.  Gastrointestinal: Negative for abdominal pain, diarrhea, nausea and vomiting.  Genitourinary: Negative for enuresis.  Musculoskeletal: Negative for back pain.       Admits to left calf pain.  Skin: Negative for rash.  Neurological: Negative for dizziness and facial asymmetry.  Hematological: Does not bruise/bleed easily.    Physical Exam Updated Vital Signs BP (!) 161/89 (BP Location: Right Arm)   Pulse 78   Temp 99 F (37.2 C) (Oral)   Resp 16   Ht 5\' 9"  (1.753 m)   Wt 99.8 kg   SpO2 98%   BMI 32.49 kg/m   Physical Exam Vitals and nursing note reviewed.  Constitutional:      General: He is not in acute distress.    Appearance: He is not ill-appearing.  HENT:     Head: Normocephalic and atraumatic.     Nose: No congestion.     Mouth/Throat:     Mouth: Mucous membranes are moist.     Pharynx: Oropharynx is clear.  Eyes:     General: No scleral icterus. Cardiovascular:     Rate and Rhythm: Normal rate and regular rhythm.     Pulses: Normal pulses.     Heart sounds: No murmur heard.  No friction rub. No gallop.   Pulmonary:     Effort: No respiratory distress.     Breath sounds: No wheezing, rhonchi or rales.  Abdominal:     General: There is no distension.     Tenderness: There is no abdominal tenderness. There is no guarding.  Musculoskeletal:        General: Tenderness present. No swelling.     Right lower leg: No edema.      Left lower leg: No edema.     Comments: Patient's left leg was visualized, no erythema or edema noted, tenderness upon palpation along the lateral and dorsal side of his mid calf.  Neurovascular fully intact.  Skin:    General: Skin is warm and dry.     Findings: No rash.  Neurological:     Mental Status: He is alert.  Psychiatric:        Mood and Affect: Mood normal.     ED Results / Procedures / Treatments   Labs (all labs ordered are listed, but only  abnormal results are displayed) Labs Reviewed - No data to display  EKG None  Radiology US Venous Img Lower Bilateral  Result Date: 12/15/2019 CLINICAL DATA:  Left lower extremity pain 3 days. EXAM: BILATERAL LOWER EXTREMITY VENOUS DOPPLER ULTRASOUND TECHNIQUE: Gray-scale sonography with compression, as well as color and duplex ultrasound, were performed to evaluate the deep venous system(s) from the level of the common femoral vein through the popliteal and proximal calf veins. COMPARISON:  None. FINDINGS: BILATERAL LOWER EXTREMITIES: Normal compressibility of the common femoral, superficial femoral, and popliteal veins, as well as the visualized calf veins. Visualized portions of profunda femoral vein and great saphenous vein unremarkable. No filling defects to suggest DVT on grayscale or color Doppler imaging. Doppler waveforms show normal direction of venous flow, normal respiratory plasticity and response to augmentation. OTHER None. Limitations: none IMPRESSION: No evidence of venous thrombus in bilateral lower extremities. Electronically Signed   By: Marin Olp M.D.   On: 12/15/2019 14:50    Procedures Procedures (including critical care time)  Medications Ordered in ED Medications  lidocaine (LIDODERM) 5 % 1 patch (has no administration in time range)    ED Course  I have reviewed the triage vital signs and the nursing notes.  Pertinent labs & imaging results that were available during my care of the patient were  reviewed by me and considered in my medical decision making (see chart for details).    MDM Rules/Calculators/A&P                          I have personally reviewed all imaging, labs and have interpreted them.  Patient presents to the emergency department with chief complaint of left calf pain x4 days.  On exam he was alert and oriented, did not appear in acute stress, vital signs reassuring.  Lung sounds are clear bilaterally, abdomen soft nontender to palpation, patient's left calf is tender to palpation on the lateral to dorsal side.  Neurovascular fully intact.  Will order DVT study for further evaluation.   I have low suspicion patient suffering from systemic infection as patient is nontoxic-appearing, vital signs reassuring, no obvious source infection on exam.  Low suspicion patient suffering from septic arthritis as patient denies IV drug use, denies fever, chills, rash, on exam there is no erythematous joints noted, patient full range of motion in his leg.  Low suspicion for DVT as DVT study was negative.  Low suspicion for PE as he denies chest pain, shortness of breath, low risk factors for PE, [place] negative.  Low suspicion for fracture or dislocation as patient denies recent trauma to the area, tenderness is on the calf muscle and not in the knee joint or tibia.  Due to well-appearing patient, benign physical exam further lab and imaging not warranted at this time.  I suspect patient suffering from muscle strain or possible arthritis.  Will recommend over-the-counter pain medications and following up with his PCP for further evaluation management.  Patient appears resting comfortably, no signs of distress, vital signs reassuring.  Patient discussed with attending who agrees with assessment and plan.  Patient is given at home care and strict return precautions.  Patient verbalized understood agreed to plan. Final Clinical Impression(s) / ED Diagnoses Final diagnoses:  Pain of left calf      Rx / DC Orders ED Discharge Orders    None       Marcello Fennel, PA-C 12/15/19 1519    [place],  Broadus John, MD 12/16/19 [phone]  Shared service with [facility].  I have personally seen and examined the patient, providing direct face to face care.  Physical exam findings and plan include patient presents with isolated left calf pain for 3 days, no injuries, no fever or external signs of infection on exam.  No concern for acute arterial occlusion at this time, ultrasound DVT pending.    Elnora Morrison, MD 12/17/19 Kathyrn Drown

## 2019-12-30 DIAGNOSIS — Z955 Presence of coronary angioplasty implant and graft: Secondary | ICD-10-CM | POA: Insufficient documentation

## 2020-01-23 ENCOUNTER — Other Ambulatory Visit: Payer: Self-pay

## 2020-01-23 DIAGNOSIS — R972 Elevated prostate specific antigen [PSA]: Secondary | ICD-10-CM

## 2020-01-28 ENCOUNTER — Other Ambulatory Visit: Payer: Self-pay

## 2020-01-28 ENCOUNTER — Ambulatory Visit (INDEPENDENT_AMBULATORY_CARE_PROVIDER_SITE_OTHER): Payer: Medicare HMO | Admitting: Urology

## 2020-01-28 ENCOUNTER — Encounter: Payer: Self-pay | Admitting: Urology

## 2020-01-28 VITALS — BP 154/76 | HR 96 | Temp 99.3°F | Ht 69.0 in | Wt 220.0 lb

## 2020-01-28 DIAGNOSIS — R972 Elevated prostate specific antigen [PSA]: Secondary | ICD-10-CM | POA: Diagnosis not present

## 2020-01-28 LAB — URINALYSIS, ROUTINE W REFLEX MICROSCOPIC
Bilirubin, UA: NEGATIVE
Glucose, UA: NEGATIVE
Ketones, UA: NEGATIVE
Leukocytes,UA: NEGATIVE
Nitrite, UA: NEGATIVE
Protein,UA: NEGATIVE
Specific Gravity, UA: 1.02 (ref 1.005–1.030)
Urobilinogen, Ur: 0.2 mg/dL (ref 0.2–1.0)
pH, UA: 6 (ref 5.0–7.5)

## 2020-01-28 LAB — MICROSCOPIC EXAMINATION
Bacteria, UA: NONE SEEN
Epithelial Cells (non renal): NONE SEEN /hpf (ref 0–10)
Renal Epithel, UA: NONE SEEN /hpf
WBC, UA: NONE SEEN /hpf (ref 0–5)

## 2020-01-28 MED ORDER — LEVOFLOXACIN 750 MG PO TABS: 750.0000 mg | ORAL_TABLET | Freq: Every day | ORAL | 0 refills | Status: AC

## 2020-01-28 NOTE — Progress Notes (Signed)
H&P  Chief Complaint: Elevated PSA  History of Present Illness:  11.2.2021:   ED Pt is experiencing ED symptoms and reports "it doesn't work like it used to" but isn't interested in pharmaceutical intervention at this time.  BPH Pt notes that he has a diminished FOS but no significant LUTS otherwise.  Elevated PSA  9.14.2021 - 5.7.  This is up from 4.4 and 4.8 on consecutive draws. Pt has family h/o prostate cancer.  IPSS Questionnaire (AUA-7): Over the past month.   1)  How often have you had a sensation of not emptying your bladder completely after you finish urinating?  0 - Not at all  2)  How often have you had to urinate again less than two hours after you finished urinating? 3 - About half the time  3)  How often have you found you stopped and started again several times when you urinated?  0 - Not at all  4) How difficult have you found it to postpone urination?  0 - Not at all  5) How often have you had a weak urinary stream?  1 - Less than 1 time in 5  6) How often have you had to push or strain to begin urination?  0 - Not at all  7) How many times did you most typically get up to urinate from the time you went to bed until the time you got up in the morning?  2 - 2 times  Total score:  0-7 mildly symptomatic   8-19 moderately symptomatic   20-35 severely symptomatic  QoL score: 6   (below copied from [place] records):  4.20.2021: Elevated PSA:  His PSA is 4.8. He has not had PSA's drawn prior to this one. He has not had a prostate nodule on a physical examination. He has not had recurrent prostate infections or chronic prostatitis.  He has not been on antibiotics for prostate infections previously. He has not had a prostate biopsy done.  Family members with diagnosed prostate cancer include his brother.   Healthy 69 year old male sent by Dr. Barry Dienes in [place] for elevated PSA. His PSA was checked in October of last year was 4.8. By patient's history it was  recheck last week and was 4.4. He does have a family history of prostate cancer--his brother was apparently treated for this.  Past Medical History:  Diagnosis Date  . Coronary artery disease 08/2011   With drug-eluting stent to right coronary artery and obtuse marginal vessel  . Hyperlipemia   . Hypertension     Past Surgical History:  Procedure Laterality Date  . CARDIAC CATHETERIZATION     Drug-eluting stent to proximal right coronary artery and first acute marginal vessel  . COLONOSCOPY WITH PROPOFOL N/A 01/18/2018   Procedure: COLONOSCOPY WITH PROPOFOL;  Surgeon: Daneil Dolin, MD;  Location: [facility];  Service: Endoscopy;  Laterality: N/A;  10:15am  . LEFT HEART CATHETERIZATION WITH CORONARY ANGIOGRAM N/A 08/16/2011   Procedure: LEFT HEART CATHETERIZATION WITH CORONARY ANGIOGRAM;  Surgeon: Peter M Martinique, MD;  Location: [facility] CATH LAB;  Service: Cardiovascular;  Laterality: N/A;  . POLYPECTOMY  01/18/2018   Procedure: POLYPECTOMY;  Surgeon: Daneil Dolin, MD;  Location: [facility];  Service: Endoscopy;;  colon    Home Medications:  Allergies as of 01/28/2020   No Known Allergies     Medication List       Accurate as of January 28, 2020 12:46 PM. If you have any  questions, ask your nurse or doctor.        aspirin EC 81 MG tablet Take 1 tablet (81 mg total) by mouth daily. What changed:   when to take this  reasons to take this   ezetimibe 10 MG tablet Commonly known as: ZETIA Take 1 tablet (10 mg total) by mouth daily.   metoprolol tartrate 50 MG tablet Commonly known as: LOPRESSOR Take 1 tablet (50 mg total) by mouth 2 (two) times daily. What changed: when to take this   Repatha SureClick 034 MG/ML Soaj Generic drug: Evolocumab Inject 140 mg into the skin every 14 (fourteen) days.   valsartan 160 MG tablet Commonly known as: DIOVAN Take 160 mg by mouth daily.       Allergies: No Known Allergies  Family History  Problem Relation Age of Onset   . Diabetes Brother   . Diabetes Sister   . Hypertension Brother   . Hypertension Sister   . Hyperlipidemia Sister   . Hyperlipidemia Brother   . Colon cancer Neg Hx     Social History:  reports that he has been smoking cigarettes and cigars. He started smoking about 55 years ago. He quit smokeless tobacco use about 26 years ago.  His smokeless tobacco use included chew. He reports current alcohol use. He reports that he does not use drugs.  ROS: A complete review of systems was performed.  All systems are negative except for pertinent findings as noted.  Physical Exam:  Vital signs in last 24 hours: There were no vitals taken for this visit. Constitutional:  Alert and oriented, No acute distress Cardiovascular: Regular rate  Respiratory: Normal respiratory effort Neurologic: Grossly intact, no focal deficits Psychiatric: Normal mood and affect  I have reviewed prior pt notes  I have reviewed notes from referring/previous physicians  I have reviewed urinalysis results  I have reviewed prior PSA results    Radiologic Imaging: No results found.  Impression/Assessment:  1. PSA - Most recent PSA is 5.7 and establishes his trend as elevating.  2. BPH - Previous DRE shows that prostate is enlarged to approximately 50 grams - Pt symptoms are minimal and he requires no intervention at this time.  3. ED - Pt symptoms are stable.  Plan:  Pt scheduled for next available TRUSP/bx and oriented to the procedure, risks, and complications - Pt advised to discontinue any aspirin intake 3 days before the procedure.  CC: Dr. Althea Grimmer

## 2020-01-28 NOTE — Progress Notes (Signed)
Urological Symptom Review  Patient is experiencing the following symptoms: Get up at night to urinate   Review of Systems  Gastrointestinal (upper)  : Indigestion/heartburn  Gastrointestinal (lower) : Negative for lower GI symptoms  Constitutional : Weight loss  Skin: Itching  Eyes: Negative for eye symptoms  Ear/Nose/Throat : Negative for Ear/Nose/Throat symptoms  Hematologic/Lymphatic: Negative for Hematologic/Lymphatic symptoms  Cardiovascular : Chest pain  Respiratory : Negative for respiratory symptoms  Endocrine: Negative for endocrine symptoms  Musculoskeletal: Negative for musculoskeletal symptoms  Neurological: Headaches  Psychologic: Negative for psychiatric symptoms

## 2020-01-28 NOTE — Patient Instructions (Addendum)
Appointment Time: 8:30a.m. Please arrive by 8:15a.m. Appointment Date: 03/03/20  Location: [facility] Department   Prostate Biopsy Instructions  Stop all aspirin or blood thinners (aspirin, plavix, coumadin, warfarin, motrin, ibuprofen, advil, aleve, naproxen, naprosyn) for 7 days prior to the procedure.  If you have any questions about stopping these medications, please contact your primary care physician or cardiologist.  Having a light meal prior to the procedure is recommended.  If you are diabetic or have low blood sugar please bring a small snack or glucose tablet.  A Fleets enema is needed to be purchased over the counter at a local pharmacy and used 2 hours before you scheduled appointment.  This can be purchased over the counter at any pharmacy.  Antibiotics will be administered in the clinic at the time of the procedure and 1 tablet has been sent to your pharmacy. Please take the antibiotic as prescribed.    Please bring someone with you to the procedure to drive you home if you are given a valium to take prior to your procedure.   If you have any questions or concerns, please feel free to call the office at [phone] or send a Mychart message.    Thank you, [facility]

## 2020-02-19 ENCOUNTER — Other Ambulatory Visit: Payer: Self-pay

## 2020-02-19 ENCOUNTER — Ambulatory Visit (INDEPENDENT_AMBULATORY_CARE_PROVIDER_SITE_OTHER): Payer: Medicare HMO | Admitting: Student

## 2020-02-19 ENCOUNTER — Encounter: Payer: Self-pay | Admitting: Student

## 2020-02-19 VITALS — BP 126/68 | HR 76 | Ht 69.0 in | Wt 214.2 lb

## 2020-02-19 DIAGNOSIS — E785 Hyperlipidemia, unspecified: Secondary | ICD-10-CM | POA: Diagnosis not present

## 2020-02-19 DIAGNOSIS — I714 Abdominal aortic aneurysm, without rupture, unspecified: Secondary | ICD-10-CM

## 2020-02-19 DIAGNOSIS — I2510 Atherosclerotic heart disease of native coronary artery without angina pectoris: Secondary | ICD-10-CM | POA: Diagnosis not present

## 2020-02-19 DIAGNOSIS — I10 Essential (primary) hypertension: Secondary | ICD-10-CM | POA: Diagnosis not present

## 2020-02-19 NOTE — Progress Notes (Signed)
Cardiology Office Note    Date:  02/19/2020   ID:  Manuel, Nguyen 10-13-1950, MRN [id number]  PCP:  [name], NP  Cardiologist: Kate Sable, MD (Inactive) --> Will need to switch to new MD at next visit  Chief Complaint  Patient presents with  . Follow-up    6 month visit    History of Present Illness:    Manuel Nguyen is a 69 y.o. male with past medical history of CAD (s/p STEMI in 07/2011 with DES to OM1 and noted to have CTO of RCA), HTN and HLD (statin intolerant) who presents to the office today for 10-month follow-up.  He was last examined by Dr. Bronson Ing in 05/2019 and denied any recent chest pain or palpitations at that time. He was continued on ASA and beta-blocker therapy. Was referred back to the [facility] for consideration of PCSK9 inhibitor.  In the interim, he did contact our office in 08/2019 for follow-up from a recent ultrasound obtained at an outside facility. Appears he had a retroperitoneal ultrasound in 06/2019 at [facility] and in the reports this showed aneurysmal dilation of the distal aorta measuring 5.5 cm.  In talking with the patient today, he reports overall doing well from a cardiac perspective since his last visit. His activity has recently been limited secondary to arthritis but this is improving with use of Tylenol. He denies any recent chest pain or palpitations. No recent orthopnea, PND or lower extremity edema. He did bring with him today a copy of labs from his PCP which showed his LDL had improved to 97 when checked in 11/2019.   Past Medical History:  Diagnosis Date  . Coronary artery disease 08/2011   With drug-eluting stent to right coronary artery and obtuse marginal vessel  . Hyperlipemia   . Hypertension     Past Surgical History:  Procedure Laterality Date  . CARDIAC CATHETERIZATION     Drug-eluting stent to proximal right coronary artery and first acute marginal vessel  . COLONOSCOPY WITH PROPOFOL N/A  01/18/2018   Procedure: COLONOSCOPY WITH PROPOFOL;  Surgeon: Daneil Dolin, MD;  [facility];  Service: Endoscopy;  Laterality: N/A;  10:15am  . LEFT HEART CATHETERIZATION WITH CORONARY ANGIOGRAM N/A 08/16/2011   Procedure: LEFT HEART CATHETERIZATION WITH CORONARY ANGIOGRAM;  Surgeon: Peter M Martinique, MD;  Location: [facility] CATH LAB;  Service: Cardiovascular;  Laterality: N/A;  . POLYPECTOMY  01/18/2018   Procedure: POLYPECTOMY;  Surgeon: Daneil Dolin, MD;  [facility];  Service: Endoscopy;;  colon    Current Medications: Outpatient Medications Prior to Visit  Medication Sig Dispense Refill  . Cholecalciferol 25 MCG (1000 UT) capsule 1 capsule 1 (one) time each day    . Evolocumab (REPATHA SURECLICK) 196 MG/ML SOAJ Inject 140 mg into the skin every 14 (fourteen) days. 2 pen 11  . metoprolol (LOPRESSOR) 50 MG tablet Take 1 tablet (50 mg total) by mouth 2 (two) times daily. (Patient taking differently: Take 50 mg by mouth daily. ) 60 tablet 6  . valsartan (DIOVAN) 160 MG tablet Take 160 mg by mouth daily.    Marland Kitchen aspirin EC 81 MG tablet Take 1 tablet (81 mg total) by mouth daily. (Patient taking differently: Take 81 mg by mouth daily as needed. ) 90 tablet 3  . ezetimibe (ZETIA) 10 MG tablet Take 1 tablet (10 mg total) by mouth daily. 90 tablet 3  . pioglitazone (ACTOS) 15 MG tablet  No facility-administered medications prior to visit.     Allergies:   Patient has no known allergies.   Social History   Socioeconomic History  . Marital status: Divorced    Spouse name: Not on file  . Number of children: Not on file  . Years of education: Not on file  . Highest education level: Not on file  Occupational History  . Not on file  Tobacco Use  . Smoking status: Former Smoker    Types: Cigarettes, Cigars    Start date: 12/21/1964    Quit date: 08/15/1985    Years since quitting: 34.5  . Smokeless tobacco: Former Systems developer    Types: [place] date: 11/26/1993  .  Tobacco comment: Cigar once a week (as of 12/14/17)  Vaping Use  . Vaping Use: Never used  Substance and Sexual Activity  . Alcohol use: Yes    Alcohol/week: 0.0 standard drinks    Comment: Small drink once a week (weekend) as of 12/14/17  . Drug use: No  . Sexual activity: Yes    Partners: Female  Other Topics Concern  . Not on file  Social History Narrative  . Not on file   Social Determinants of Health   Financial Resource Strain:   . Difficulty of Paying Living Expenses: Not on file  Food Insecurity:   . Worried About Charity fundraiser in the Last Year: Not on file  . Ran Out of Food in the Last Year: Not on file  Transportation Needs:   . Lack of Transportation (Medical): Not on file  . Lack of Transportation (Non-Medical): Not on file  Physical Activity:   . Days of Exercise per Week: Not on file  . Minutes of Exercise per Session: Not on file  Stress:   . Feeling of Stress : Not on file  Social Connections:   . Frequency of Communication with Friends and Family: Not on file  . Frequency of Social Gatherings with Friends and Family: Not on file  . Attends Religious Services: Not on file  . Active Member of Clubs or Organizations: Not on file  . Attends Archivist Meetings: Not on file  . Marital Status: Not on file     Family History:  The patient's family history includes Diabetes in his brother and sister; Hyperlipidemia in his brother and sister; Hypertension in his brother and sister.   Review of Systems:   Please see the history of present illness.     General:  No chills, fever, night sweats or weight changes.  Cardiovascular:  No chest pain, dyspnea on exertion, edema, orthopnea, palpitations, paroxysmal nocturnal dyspnea. Dermatological: No rash, lesions/masses Respiratory: No cough, dyspnea MSK: Positive for leg pain (improved).  Urologic: No hematuria, dysuria Abdominal:   No nausea, vomiting, diarrhea, bright red blood per rectum, melena,  or hematemesis Neurologic:  No visual changes, wkns, changes in mental status. All other systems reviewed and are otherwise negative except as noted above.   Physical Exam:    VS:  BP 126/68   Pulse 76   Ht 5\' 9"  (1.753 m)   Wt 214 lb 3.2 oz (97.2 kg)   SpO2 97%   BMI 31.63 kg/m    General: Well developed, well nourished,male appearing in no acute distress. Head: Normocephalic, atraumatic. Neck: No carotid bruits. JVD not elevated.  Lungs: Respirations regular and unlabored, without wheezes or rales.  Heart: Regular rate and rhythm. No S3 or S4.  No murmur, no  rubs, or gallops appreciated. Abdomen: Appears non-distended. No obvious abdominal masses. Msk:  Strength and tone appear normal for age. No obvious joint deformities or effusions. Extremities: No clubbing or cyanosis. No lower extremity edema.  Distal pedal pulses are 2+ bilaterally. Neuro: Alert and oriented X 3. Moves all extremities spontaneously. No focal deficits noted. Psych:  Responds to questions appropriately with a normal affect. Skin: No rashes or lesions noted  Wt Readings from Last 3 Encounters:  02/19/20 214 lb 3.2 oz (97.2 kg)  01/28/20 220 lb (99.8 kg)  12/15/19 220 lb (99.8 kg)     Studies/Labs Reviewed:   EKG:  EKG is ordered today.  The ekg ordered today demonstrates NSR, HR 76 with no acute ST changes when compared to prior tracings.   Recent Labs: No results found for requested labs within last 8760 hours.   Lipid Panel    Component Value Date/Time   CHOL 300 (H) 08/17/2011 0954   TRIG 171 (H) 08/17/2011 0954   HDL 40 08/17/2011 0954   CHOLHDL 7.5 08/17/2011 0954   VLDL 34 08/17/2011 0954   LDLCALC 226 (H) 08/17/2011 0954    Additional studies/ records that were reviewed today include:   Echocardiogram: 07/2011 Study Conclusions   - Left ventricle: Systolic function was normal. The  estimated ejection fraction was in the range of 55% to  60%. Basal posterior and basal  anterolateral hypokinesis.  Features are consistent with a pseudonormal left  ventricular filling pattern, with concomitant abnormal  relaxation and increased filling pressure (grade 2  diastolic dysfunction).  - Aortic valve: There was no stenosis.  - Mitral valve: Mildly calcified annulus. Trivial  regurgitation.  - Left atrium: The atrium was mildly dilated.  - Right ventricle: The cavity size was normal. Systolic  function was normal.  - Pulmonary arteries: No complete TR doppler jet so unable  to estimate PA systolic pressure.  - Systemic veins: IVC measured 2.0 cm with normal  respirophasic variation, suggesting RA pressure 6-10 mmHg.  Impressions:   - Normal LV size and systolic function, EF 81-44%. Basal  posterior and basal anterolateral hypokinesis. Moderate  diastolic dysfunction. Normal RV size and systolic  function. No significant valvular dysfunction.    Assessment:    1. Coronary artery disease involving native coronary artery of native heart without angina pectoris   2. Essential hypertension   3. Hyperlipidemia LDL goal <70   4. AAA (abdominal aortic aneurysm) without rupture ([place])      Plan:   In order of problems listed above:  1. CAD - He is s/p STEMI in 07/2011 with DES to OM1 and noted to have CTO of RCA. He remains active at baseline and denies any recent anginal symptoms. Continue current medical therapy with Lopressor 50 mg twice daily and Repatha given his prior statin intolerance. He was on ASA 81 mg daily and this is currently on hold per Urology given his upcoming prostate biopsy. He was encouraged to resume ASA once cleared to do so from a Urology perspective following his procedure.  2. HTN - BP is well controlled at 126/68 during today's visit. Continue current medication regimen with Lopressor 50 mg twice daily and Valsartan 160 mg daily.  3. HLD - He has been intolerant to multiple statins in the past and remains on  Repatha. His LDL was previously greater than 200 and was improved to 97 by most recent labs in 11/2019.  4. AAA - Retroperitoneal ultrasound in 06/2019 at [facility] showed  aneurysmal dilation of the distal aorta measuring 5.5 cm by review of the report. Will order a dedicated AAA [place]. If actually this enlarged, would need referral to Vascular Surgery for closer follow-up.    Medication Adjustments/Labs and Tests Ordered: Current medicines are reviewed at length with the patient today.  Concerns regarding medicines are outlined above.  Medication changes, Labs and Tests ordered today are listed in the Patient Instructions below. Patient Instructions  Medication Instructions:  Your physician recommends that you continue on your current medications as directed. Please refer to the Current Medication list given to you today.  *If you need a refill on your cardiac medications before your next appointment, please call your pharmacy*   Lab Work: NONE   If you have labs (blood work) drawn today and your tests are completely normal, you will receive your results only by: Marland Kitchen MyChart Message (if you have MyChart) OR . A paper copy in the mail If you have any lab test that is abnormal or we need to change your treatment, we will call you to review the results.   Testing/Procedures: Your physician has requested that you have an abdominal aorta duplex. During this test, an ultrasound is used to evaluate the aorta. Allow 30 minutes for this exam. Do not eat after midnight the day before and avoid carbonated beverages    Follow-Up: At [facility], you and your health needs are our priority.  As part of our continuing mission to provide you with exceptional heart care, we have created designated Provider Care Teams.  These Care Teams include your primary Cardiologist (physician) and Advanced Practice Providers (APPs -  Physician Assistants and Nurse Practitioners) who all work together to provide  you with the care you need, when you need it.  We recommend signing up for the patient portal called "MyChart".  Sign up information is provided on this After Visit Summary.  MyChart is used to connect with patients for Virtual Visits (Telemedicine).  Patients are able to view lab/test results, encounter notes, upcoming appointments, etc.  Non-urgent messages can be sent to your provider as well.   To learn more about what you can do with MyChart, go to [email or web address].    Your next appointment:   6 month(s)  The format for your next appointment:   In Person  Provider:   MD or Bernerd Pho, PA-C    Other Instructions Thank you for choosing [facility]!     Signed, Erma Heritage, PA-C  02/19/2020 5:04 PM    [address] [place],  [address] Phone: [phone] Fax: [phone]

## 2020-02-19 NOTE — Patient Instructions (Signed)
Medication Instructions:  Your physician recommends that you continue on your current medications as directed. Please refer to the Current Medication list given to you today.  *If you need a refill on your cardiac medications before your next appointment, please call your pharmacy*   Lab Work: NONE   If you have labs (blood work) drawn today and your tests are completely normal, you will receive your results only by: Marland Kitchen MyChart Message (if you have MyChart) OR . A paper copy in the mail If you have any lab test that is abnormal or we need to change your treatment, we will call you to review the results.   Testing/Procedures: Your physician has requested that you have an abdominal aorta duplex. During this test, an ultrasound is used to evaluate the aorta. Allow 30 minutes for this exam. Do not eat after midnight the day before and avoid carbonated beverages    Follow-Up: At [facility], you and your health needs are our priority.  As part of our continuing mission to provide you with exceptional heart care, we have created designated Provider Care Teams.  These Care Teams include your primary Cardiologist (physician) and Advanced Practice Providers (APPs -  Physician Assistants and Nurse Practitioners) who all work together to provide you with the care you need, when you need it.  We recommend signing up for the patient portal called "MyChart".  Sign up information is provided on this After Visit Summary.  MyChart is used to connect with patients for Virtual Visits (Telemedicine).  Patients are able to view lab/test results, encounter notes, upcoming appointments, etc.  Non-urgent messages can be sent to your provider as well.   To learn more about what you can do with MyChart, go to [email or web address].    Your next appointment:   6 month(s)  The format for your next appointment:   In Person  Provider:   MD or Bernerd Pho, PA-C    Other Instructions Thank you for  choosing [facility]!

## 2020-03-02 NOTE — Procedures (Signed)
Risks, benefits, and some of the potential complications of a transrectal ultrasounds of the prostate (TRUSP) with biopsies were discussed at length with the patient including gross hematuria, blood in the bowel movements, hematospermia, bacteremia, infection, voiding discomfort, urinary retention, fever, chills, sepsis, blood transfusion, death, and others. All questions were answered. Informed consent was obtained. The patient confirmed that he had taken his pre-procedure antibiotic. All anticoagulants were discontinued prior to the procedure. The patient emptied his bladder. He was positioned in a comfortable left lateral decubitus position with hips and knees acutely flexed.  The rectal probe was inserted into the rectum without difficulty. 10cc of 2% Lidocaine without epinephrine was instilled with a spinal needle using ultrasound guidance near the junction of each seminal vesicle and the prostate.  Sequential transverse (axial) scans were made in small increments beginning at the seminal vesicles and ending at the prostatic apex. Sequential longitudinal (saggital) scans were made in small increments beginning at the right lateral prostate and ending at the left lateral prostate. Excellent anatomical imaging was obtained. The peripheral, transitional, and central zones were well-defined. The seminal vesicles were normal.  Prostate volume 49.76 ml.  There were no hypoechoic areas. 12 needle core biopsies were performed. 1 biopsy each was taken from the following areas:  Right lateral base, right medial base, right lateral mid prostate, right medial mid prostate, right lateral apical prostate, right medial apical prostate, left lateral base, left medial base, left lateral mid prostate, left medial mid prostate, left lateral apical prostate, left medial apical prostate.. Minimal prostatic calcifications were noted. Excellent biopsy specimens were obtained.  Follow-up rectal examination was  unremarkable. The procedure was well-tolerated and without complications. Antibiotic instructions were given. The patient was told that:  For several days:  he should increase his fluid intake and limit strenuous activity  he might have mild discomfort at the base of his penis or in his rectum  he might have blood in his urine or blood in his bowel movements  For 2-3 months:  he might have blood in his ejaculate (semen)  Instructions were given to call the office immedicately for blood clots in the urine or bowel movements, difficulty urinating, inability to urinate, urinary retention, painful or frequent urination, fever, chills, nausea, vomiting, or other illness. The patient stated that he understood these instructions and would comply with them. We told the patient that prostate biopsy pathology reports are usually available within 3-5 working days, unless a pathologic second opinion is required, which may take 7-14 days. We told him to contact us to check on the status of his biopsy if he has not heard from [place] within 7 days. The patient left the ultrasound examination room in stable condition.

## 2020-03-03 ENCOUNTER — Ambulatory Visit (HOSPITAL_COMMUNITY)
Admission: RE | Admit: 2020-03-03 | Discharge: 2020-03-03 | Disposition: A | Discharge status: Home / Self Care | Payer: Medicare HMO | Source: Ambulatory Visit | Attending: Urology | Admitting: Urology

## 2020-03-03 ENCOUNTER — Ambulatory Visit (INDEPENDENT_AMBULATORY_CARE_PROVIDER_SITE_OTHER): Payer: Medicare HMO | Admitting: Urology

## 2020-03-03 ENCOUNTER — Other Ambulatory Visit: Payer: Self-pay

## 2020-03-03 ENCOUNTER — Other Ambulatory Visit: Payer: Self-pay | Admitting: Urology

## 2020-03-03 ENCOUNTER — Encounter (HOSPITAL_COMMUNITY): Payer: Self-pay

## 2020-03-03 DIAGNOSIS — R972 Elevated prostate specific antigen [PSA]: Secondary | ICD-10-CM

## 2020-03-03 MED ORDER — GENTAMICIN SULFATE 40 MG/ML IJ SOLN
INTRAMUSCULAR | Status: AC
  Administered 2020-03-03: 160 mg via INTRAMUSCULAR
  Filled 2020-03-03: qty 4

## 2020-03-03 MED ORDER — GENTAMICIN SULFATE 40 MG/ML IJ SOLN: 160.0000 mg | Freq: Once | INTRAMUSCULAR | Status: AC

## 2020-03-03 MED ORDER — LIDOCAINE HCL (PF) 2 % IJ SOLN: 10.0000 mL | Freq: Once | INTRAMUSCULAR | Status: AC

## 2020-03-03 MED ORDER — LIDOCAINE HCL (PF) 2 % IJ SOLN
INTRAMUSCULAR | Status: AC
  Administered 2020-03-03: 10 mL
  Filled 2020-03-03: qty 10

## 2020-03-03 NOTE — Discharge Instructions (Signed)

## 2020-03-03 NOTE — Sedation Documentation (Signed)
PT tolerated prostate biopsy procedure well today. Labs obtained and sent for pathology. PT ambulatory at discharge with no acute distress noted and verbalized understanding of discharge instructions. PT to follow up with urologist as scheduled. 

## 2020-03-10 ENCOUNTER — Ambulatory Visit (INDEPENDENT_AMBULATORY_CARE_PROVIDER_SITE_OTHER): Payer: Medicare HMO | Admitting: Urology

## 2020-03-10 ENCOUNTER — Encounter: Payer: Self-pay | Admitting: Urology

## 2020-03-10 ENCOUNTER — Other Ambulatory Visit: Payer: Self-pay

## 2020-03-10 VITALS — BP 124/71 | HR 118 | Temp 98.7°F | Ht 69.0 in | Wt 214.0 lb

## 2020-03-10 DIAGNOSIS — C61 Malignant neoplasm of prostate: Secondary | ICD-10-CM

## 2020-03-10 DIAGNOSIS — N5201 Erectile dysfunction due to arterial insufficiency: Secondary | ICD-10-CM | POA: Diagnosis not present

## 2020-03-10 MED ORDER — SILDENAFIL CITRATE 100 MG PO TABS: 100.0000 mg | ORAL_TABLET | Freq: Every day | ORAL | 99 refills | Status: DC | PRN

## 2020-03-10 NOTE — Progress Notes (Signed)
H&P  Chief Complaint: Elevated PSA  History of Present Illness:  12.14.2021: Here for discussion on prostate cancer, recently diagnosed.  12.7.2021: TRUS/Bx. prostate volume 49.76 mL. 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 6 cores w/ GS 3+3 pattern-- Lt mid lateral 5% of core, Lt apex lateral 30% of core,  Rt apex medial, 5% of core,  Rt apex lateral, 20% of core, Rt mid lateral, 5% of core, Rt base lateral, 10% of core 2 cores revealed GS 3+4 pattern --Rt mid medial, 10% of core,  Lt base lateral, 10% of core  (below copied from [facility] records):  4.20.2021: Elevated PSA: His PSA is 4.8. He has not had PSA's drawn prior to this one. He has not had a prostate nodule on a physical examination. He has not had recurrent prostate infections or chronic prostatitis.  He has not been on antibiotics for prostate infections previously. He has not had a prostate biopsy done.  Family members with diagnosed prostate cancer include his brother.     11.2.2021:  ED Pt is experiencing ED symptoms and reports "it doesn't work like it used to" but isn't interested in pharmaceutical intervention at this time.  BPH Pt notes that he has a diminished FOS but no significant LUTS otherwise.  Elevated PSA  9.14.2021 - 5.7.  This is up from 4.4 and 4.8 on consecutive draws. Pt has family h/o prostate cancer  Past Medical History:  Diagnosis Date  . Coronary artery disease 08/2011   With drug-eluting stent to right coronary artery and obtuse marginal vessel  . Hyperlipemia   . Hypertension     Past Surgical History:  Procedure Laterality Date  . CARDIAC CATHETERIZATION     Drug-eluting stent to proximal right coronary artery and first acute marginal vessel  . COLONOSCOPY WITH PROPOFOL N/A 01/18/2018   Procedure: COLONOSCOPY WITH PROPOFOL;  Surgeon: Daneil Dolin, MD;  [facility];  Service: Endoscopy;  Laterality: N/A;  10:15am  . LEFT HEART CATHETERIZATION WITH CORONARY ANGIOGRAM  N/A 08/16/2011   Procedure: LEFT HEART CATHETERIZATION WITH CORONARY ANGIOGRAM;  Surgeon: Peter M Martinique, MD;  Location: [facility] CATH LAB;  Service: Cardiovascular;  Laterality: N/A;  . POLYPECTOMY  01/18/2018   Procedure: POLYPECTOMY;  Surgeon: Daneil Dolin, MD;  [facility];  Service: Endoscopy;;  colon    Home Medications:  Allergies as of 03/10/2020   No Known Allergies     Medication List       Accurate as of March 10, 2020  1:31 PM. If you have any questions, ask your nurse or doctor.        Cholecalciferol 25 MCG (1000 UT) capsule 1 capsule 1 (one) time each day   metoprolol tartrate 50 MG tablet Commonly known as: LOPRESSOR Take 1 tablet (50 mg total) by mouth 2 (two) times daily. What changed: when to take this   Repatha SureClick 427 MG/ML Soaj Generic drug: Evolocumab Inject 140 mg into the skin every 14 (fourteen) days.   valsartan 160 MG tablet Commonly known as: DIOVAN Take 160 mg by mouth daily.       Allergies: No Known Allergies  Family History  Problem Relation Age of Onset  . Diabetes Brother   . Diabetes Sister   . Hypertension Brother   . Hypertension Sister   . Hyperlipidemia Sister   . Hyperlipidemia Brother   . Colon cancer Neg Hx     Social History:  reports that he quit smoking about 34 years ago. His smoking  use included cigarettes and cigars. He started smoking about 55 years ago. He quit smokeless tobacco use about 26 years ago.  His smokeless tobacco use included chew. He reports current alcohol use. He reports that he does not use drugs.  ROS: A complete review of systems was performed.  All systems are negative except for pertinent findings as noted.  Physical Exam:  Vital signs in last 24 hours: There were no vitals taken for this visit. Constitutional:  Alert and oriented, No acute distress Cardiovascular: Regular rate  Respiratory: Normal respiratory effort GI: Abdomen is soft, nontender, nondistended, no  abdominal masses. No CVAT.  Genitourinary: Normal male phallus, testes are descended bilaterally and non-tender and without masses, scrotum is normal in appearance without lesions or masses, perineum is normal on inspection. 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 It was explained to him how his age and life expectancy, clinical stage, Gleason score, and PSA affect his prognosis, the decision to proceed with additional staging studies, as well as how that information influences recommended treatment strategies. We discussed the roles for active surveillance, radiation therapy, surgical therapy, androgen deprivation, as well as ablative therapy options for the treatment of prostate cancer as appropriate to his individual cancer situation. We discussed the risks and benefits of these options with regard to their impact on cancer control and also in terms of potential adverse events, complications, and impact on quality of life particularly related to urinary, bowel, and sexual function. The patient was encouraged to ask questions throughout the discussion today and all questions were answered to his stated satisfaction. In addition, the patient was provided with and/or directed to appropriate resources and literature for further education about prostate cancer and treatment options.  The patient does have minimal lower urinary tract symptoms, an average size prostate, and is more  interested in radiotherapy/brachytherapy than EBRT or surgery.  We will set him up an appointment to see Dr. Tammi Klippel at the regional [facility] in [place] to discuss.

## 2020-03-23 ENCOUNTER — Encounter: Payer: Self-pay | Admitting: Radiation Oncology

## 2020-03-23 NOTE — Progress Notes (Signed)
GU Location of Tumor / Histology: prostatic adenocarcinoma  If Prostate Cancer, Gleason Score is (3 + 4) and PSA is (5.7). Prostate volume: 49.76.  12/10/2019 psa 5.7   psa 4.8   psa 4.4    Biopsies of prostate (if applicable) revealed:   Past/Anticipated interventions by urology, if any: prostate biopsy, referral to Dr. Kathrynn Running for consideration of brachytherapy  Past/Anticipated interventions by medical oncology, if any: no  Weight changes, if any: no  Bowel/Bladder complaints, if any: IPSS 4. SHIM 20. Denies dysuria or hematuria. Reports occasional leakage. Denies any bowel complaints.   Nausea/Vomiting, if any: denies  Pain issues, if any:  denies  SAFETY ISSUES:  Prior radiation? denies  Pacemaker/ICD? Denies patient has a stent  Possible current pregnancy? no, male patient  Is the patient on methotrexate? denies  Current Complaints / other details:  69 year old male. Interested in brachytherapy. Resides in [place] (approximately 50 miles away)

## 2020-03-24 ENCOUNTER — Ambulatory Visit
Admission: RE | Admit: 2020-03-24 | Discharge: 2020-03-24 | Disposition: A | Discharge status: Home / Self Care | Payer: Medicare HMO | Source: Ambulatory Visit | Attending: Radiation Oncology | Admitting: Radiation Oncology

## 2020-03-24 ENCOUNTER — Encounter: Payer: Self-pay | Admitting: Medical Oncology

## 2020-03-24 ENCOUNTER — Encounter: Payer: Self-pay | Admitting: Radiation Oncology

## 2020-03-24 ENCOUNTER — Other Ambulatory Visit: Payer: Self-pay

## 2020-03-24 VITALS — BP 138/70 | HR 80 | Temp 98.5°F | Resp 20 | Ht 69.0 in | Wt 215.2 lb

## 2020-03-24 DIAGNOSIS — I10 Essential (primary) hypertension: Secondary | ICD-10-CM | POA: Insufficient documentation

## 2020-03-24 DIAGNOSIS — E785 Hyperlipidemia, unspecified: Secondary | ICD-10-CM | POA: Diagnosis not present

## 2020-03-24 DIAGNOSIS — Z7982 Long term (current) use of aspirin: Secondary | ICD-10-CM | POA: Diagnosis not present

## 2020-03-24 DIAGNOSIS — C61 Malignant neoplasm of prostate: Secondary | ICD-10-CM | POA: Insufficient documentation

## 2020-03-24 DIAGNOSIS — Z79899 Other long term (current) drug therapy: Secondary | ICD-10-CM | POA: Insufficient documentation

## 2020-03-24 DIAGNOSIS — I2510 Atherosclerotic heart disease of native coronary artery without angina pectoris: Secondary | ICD-10-CM | POA: Insufficient documentation

## 2020-03-24 HISTORY — DX: Malignant neoplasm of prostate: C61

## 2020-03-24 NOTE — Progress Notes (Signed)
Radiation Oncology         [phone] ________________________________  Initial Outpatient Consultation  Name: Manuel Nguyen MRN: [id number]  Date: 03/24/2020  DOB: 1950/04/09  JO:ACZY, Pushpinder K, NP  [name], MD   REFERRING PHYSICIAN: [name], MD  DIAGNOSIS: 69 y.o. gentleman with Stage T1c adenocarcinoma of the prostate with Gleason score of 3+4, and PSA of 5.7.    ICD-10-CM   1. Malignant neoplasm of prostate ([place])  C61     HISTORY OF PRESENT ILLNESS: Manuel Nguyen is a 69 y.o. male with a diagnosis of prostate cancer. He was initially referred to Dr. Diona Fanti in 03/2018 for an elevated PSA of 4.8, with recheck prior to consultation at 4.4. The recommendation was to proceed with prostate biopsy at that time but the patient declined and instead preferred to proceed with close monitoring of the PSA.  When he returned in 06/2019, his PSA remained stable at 4.4,  digital rectal examination was performed at that time revealing no nodules so the plan was to repeat a PSA in 6 months. However, routine lab work with his PCP in 11/2019 showed an increase to 5.7.  Accordingly, he was referred back to by Dr. Diona Fanti on 01/28/2020.  The patient proceeded to transrectal ultrasound with 12 biopsies of the prostate on 03/03/2020.  The prostate volume measured 49.76 cc.  Out of 12 core biopsies, 8 were positive.  The maximum Gleason score was 3+4, and this was seen in the right mid and left base lateral.  Additionally, Gleason 3+3 was seen in the left mid lateral (small focus), left apex lateral, right apex (small focus), right apex lateral, right mid lateral (small focus), and right base lateral.  The patient reviewed the biopsy results with his urologist and he has kindly been referred today for discussion of potential radiation treatment options.   PREVIOUS RADIATION THERAPY: No  PAST MEDICAL HISTORY:  Past Medical History:  Diagnosis Date  . Coronary artery disease  08/2011   With drug-eluting stent to right coronary artery and obtuse marginal vessel  . Hyperlipemia   . Hypertension   . Prostate cancer ([place])       PAST SURGICAL HISTORY: Past Surgical History:  Procedure Laterality Date  . CARDIAC CATHETERIZATION     Drug-eluting stent to proximal right coronary artery and first acute marginal vessel  . COLONOSCOPY WITH PROPOFOL N/A 01/18/2018   Procedure: COLONOSCOPY WITH PROPOFOL;  Surgeon: Daneil Dolin, MD;  [facility];  Service: Endoscopy;  Laterality: N/A;  10:15am  . LEFT HEART CATHETERIZATION WITH CORONARY ANGIOGRAM N/A 08/16/2011   Procedure: LEFT HEART CATHETERIZATION WITH CORONARY ANGIOGRAM;  Surgeon: Peter M Martinique, MD;  Location: [facility] CATH LAB;  Service: Cardiovascular;  Laterality: N/A;  . POLYPECTOMY  01/18/2018   Procedure: POLYPECTOMY;  Surgeon: Daneil Dolin, MD;  Location[facility];  Service: Endoscopy;;  colon  . PROSTATE BIOPSY      FAMILY HISTORY:  Family History  Problem Relation Age of Onset  . Diabetes Brother   . Prostate cancer Brother   . Diabetes Sister   . Hypertension Brother   . Hypertension Sister   . Hyperlipidemia Sister   . Hyperlipidemia Brother   . Colon cancer Neg Hx   . Pancreatic cancer Neg Hx   . Breast cancer Neg Hx     SOCIAL HISTORY:  Social History   Socioeconomic History  . Marital status: Divorced    Spouse name: Not on file  .  Number of children: Not on file  . Years of education: Not on file  . Highest education level: Not on file  Occupational History  . Not on file  Tobacco Use  . Smoking status: Former Smoker    Types: Cigarettes, Cigars    Start date: 12/21/1964    Quit date: 08/15/1985    Years since quitting: 34.6  . Smokeless tobacco: Former Systems developer    Types: [place] date: 11/26/1993  . Tobacco comment: Cigar once a week (as of 12/14/17)  Vaping Use  . Vaping Use: Never used  Substance and Sexual Activity  . Alcohol use: Yes    Alcohol/week: 0.0  standard drinks    Comment: Small drink once a week (weekend) as of 12/14/17  . Drug use: No  . Sexual activity: Yes    Partners: Female  Other Topics Concern  . Not on file  Social History Narrative  . Not on file   Social Determinants of Health   Financial Resource Strain: Not on file  Food Insecurity: Not on file  Transportation Needs: Not on file  Physical Activity: Not on file  Stress: Not on file  Social Connections: Not on file  Intimate Partner Violence: Not on file    ALLERGIES: Patient has no known allergies.  MEDICATIONS:  Current Outpatient Medications  Medication Sig Dispense Refill  . Cholecalciferol 25 MCG (1000 UT) capsule 1 capsule 1 (one) time each day    . Evolocumab (REPATHA SURECLICK) 034 MG/ML SOAJ Inject 140 mg into the skin every 14 (fourteen) days. 2 pen 11  . metoprolol (LOPRESSOR) 50 MG tablet Take 1 tablet (50 mg total) by mouth 2 (two) times daily. (Patient taking differently: Take 50 mg by mouth daily.) 60 tablet 6  . sildenafil (VIAGRA) 100 MG tablet Take 1 tablet (100 mg total) by mouth daily as needed for erectile dysfunction. Take 1/2-1 po prn 20 tablet prn  . valsartan (DIOVAN) 160 MG tablet Take 160 mg by mouth daily.     No current facility-administered medications for this encounter.    REVIEW OF SYSTEMS:  On review of systems, the patient reports that he is doing well overall. He denies any chest pain, shortness of breath, cough, fevers, chills, night sweats, unintended weight changes. He denies any bowel disturbances, and denies abdominal pain, nausea or vomiting. He denies any new musculoskeletal or joint aches or pains. His IPSS was 4, indicating mild urinary symptoms. He reports occasional leakage. His SHIM was 20, indicating he has some mild erectile dysfunction. A complete review of systems is obtained and is otherwise negative.    PHYSICAL EXAM:  Wt Readings from Last 3 Encounters:  03/24/20 215 lb 3.2 oz (97.6 kg)  03/10/20 214 lb  (97.1 kg)  02/19/20 214 lb 3.2 oz (97.2 kg)   Temp Readings from Last 3 Encounters:  03/24/20 98.5 F (36.9 C)  03/10/20 98.7 F (37.1 C)  03/03/20 98.2 F (36.8 C) (Oral)   BP Readings from Last 3 Encounters:  03/24/20 138/70  03/10/20 124/71  03/03/20 (!) 155/82   Pulse Readings from Last 3 Encounters:  03/24/20 80  03/10/20 (!) 118  03/03/20 80   Pain Assessment Pain Score: 0-No pain/10  In general this is a well appearing African American man in no acute distress. He's alert and oriented x4 and appropriate throughout the examination. Cardiopulmonary assessment is negative for acute distress, and he exhibits normal effort.     KPS = 90  100 -  Normal; no complaints; no evidence of disease. 90   - Able to carry on normal activity; minor signs or symptoms of disease. 80   - Normal activity with effort; some signs or symptoms of disease. 84   - Cares for self; unable to carry on normal activity or to do active work. 60   - Requires occasional assistance, but is able to care for most of his personal needs. 50   - Requires considerable assistance and frequent medical care. 62   - Disabled; requires special care and assistance. 2   - Severely disabled; hospital admission is indicated although death not imminent. 49   - Very sick; hospital admission necessary; active supportive treatment necessary. 10   - Moribund; fatal processes progressing rapidly. 0     - Dead  Karnofsky DA, [place], Craver LS and Burchenal [facility] [phone]) The use of the nitrogen mustards in the palliative treatment of carcinoma: with particular reference to bronchogenic carcinoma Cancer 1 634-56  LABORATORY DATA:  Lab Results  Component Value Date   WBC 6.3 12/18/2018   HGB 13.4 12/18/2018   HCT 41.8 12/18/2018   MCV 92.9 12/18/2018   PLT 249 12/18/2018   Lab Results  Component Value Date   NA 136 12/18/2018   K 4.1 12/18/2018   CL 106 12/18/2018   CO2 23 12/18/2018   Lab Results  Component  Value Date   ALT 24 12/18/2018   AST 21 12/18/2018   ALKPHOS 48 12/18/2018   BILITOT 0.4 12/18/2018     RADIOGRAPHY: [place] Transrectal Complete  Result Date: 03/24/2020 CLINICAL DATA:  Ultrasound was provided for use by the ordering physician.  No provider Interpretation or professional fees incurred.    US Guided Needle Placement  Result Date: 03/24/2020 CLINICAL DATA:  Ultrasound was provided for use by the ordering physician.  No provider Interpretation or professional fees incurred.    [place] PROSTATE BIOPSY MULTIPLE  Result Date: 03/24/2020 CLINICAL DATA:  Ultrasound was provided for use by the ordering physician.  No provider Interpretation or professional fees incurred.       IMPRESSION/PLAN:  1. 69 y.o. gentleman with Stage T1c adenocarcinoma of the prostate with Gleason Score of 3+4, and PSA of 5.7. We discussed the patient's workup and outlined the nature of prostate cancer in this setting. The patient's T stage, Gleason's score, and PSA put him into the favorable intermediate risk group. Accordingly, he is eligible for a variety of potential treatment options including brachytherapy, 5.5 weeks of external radiation, or prostatectomy. We discussed the available radiation techniques, and focused on the details and logistics of delivery. We discussed and outlined the risks, benefits, short and long-term effects associated with radiotherapy and compared and contrasted these with prostatectomy. We discussed the role of SpaceOAR in reducing the rectal toxicity associated with radiotherapy. He appears to have a good understanding of his disease and our treatment recommendations which are of curative intent.  He was encouraged to ask questions that were answered to his stated satisfaction.  At the end of the conversation, the patient is interested in moving forward with brachytherapy and use of SpaceOAR gel to reduce rectal toxicity from radiotherapy.  We will share our discussion with Dr.  Diona Fanti and move forward with scheduling his [facility] planning appointment in the near future.  The patient met briefly with Romie Jumper in our office who will be working closely with him to coordinate OR scheduling and pre and post procedure appointments.  We will contact  the pharmaceutical rep to ensure that Cherre Blanc is available at the time of procedure.  We enjoyed meeting him today and look forward to continuing to participate in his care.    [name], PA-C    Tyler Pita, MD  [facility] Direct Dial: [phone]  Fax: [phone] [email or web address]  Skype  [email or web address]  This document serves as a record of services personally performed by Tyler Pita, MD and Freeman Caldron, PA-C. It was created on their behalf by Wilburn Mylar, a trained medical scribe. The creation of this record is based on the scribe's personal observations and the provider's statements to them. This document has been checked and approved by the attending provider.

## 2020-03-30 ENCOUNTER — Telehealth: Payer: Self-pay | Admitting: *Deleted

## 2020-03-30 NOTE — Telephone Encounter (Signed)
Called patient to inform of pre-seed appts. for 04-23-20, spoke with patient and he is aware of these appts.

## 2020-04-16 ENCOUNTER — Telehealth: Payer: Self-pay | Admitting: *Deleted

## 2020-04-16 NOTE — Telephone Encounter (Signed)
CALLED PATIENT TO INFORM OF PRE-SEED APPTS. AND IMPLANT DATE, LVM FOR A RETURN CALL 

## 2020-04-21 ENCOUNTER — Other Ambulatory Visit: Payer: Self-pay | Admitting: Urology

## 2020-04-22 ENCOUNTER — Telehealth: Payer: Self-pay | Admitting: *Deleted

## 2020-04-22 NOTE — Telephone Encounter (Signed)
Called patient to remind of pre-seed appts. For 04-23-20, lvm for a return call.

## 2020-04-23 ENCOUNTER — Encounter (HOSPITAL_COMMUNITY)
Admission: RE | Admit: 2020-04-23 | Discharge: 2020-04-23 | Disposition: A | Discharge status: Home / Self Care | Payer: Medicare HMO | Source: Ambulatory Visit | Attending: Urology | Admitting: Urology

## 2020-04-23 ENCOUNTER — Other Ambulatory Visit: Payer: Self-pay

## 2020-04-23 ENCOUNTER — Ambulatory Visit
Admission: RE | Admit: 2020-04-23 | Discharge: 2020-04-23 | Disposition: A | Discharge status: Home / Self Care | Payer: Medicare HMO | Source: Ambulatory Visit | Attending: Urology | Admitting: Urology

## 2020-04-23 ENCOUNTER — Ambulatory Visit
Admission: RE | Admit: 2020-04-23 | Discharge: 2020-04-23 | Disposition: A | Discharge status: Home / Self Care | Payer: Medicare HMO | Source: Ambulatory Visit | Attending: Radiation Oncology | Admitting: Radiation Oncology

## 2020-04-23 ENCOUNTER — Ambulatory Visit (HOSPITAL_COMMUNITY)
Admission: RE | Admit: 2020-04-23 | Discharge: 2020-04-23 | Disposition: A | Discharge status: Home / Self Care | Payer: Medicare HMO | Source: Ambulatory Visit | Attending: Urology | Admitting: Urology

## 2020-04-23 DIAGNOSIS — C61 Malignant neoplasm of prostate: Secondary | ICD-10-CM | POA: Insufficient documentation

## 2020-04-23 DIAGNOSIS — Z01818 Encounter for other preprocedural examination: Secondary | ICD-10-CM | POA: Diagnosis present

## 2020-04-23 NOTE — Progress Notes (Signed)
  Radiation Oncology         [phone] ________________________________  Name: ESPN ZEMAN MRN: [id number]  Date: 04/23/2020  DOB: Aug 04, 1950  SIMULATION AND TREATMENT PLANNING NOTE PUBIC ARCH STUDY  LO:VFIE, Pushpinder K, NP  [name], MD  DIAGNOSIS:  Oncology History  Malignant neoplasm of prostate ([place])  03/03/2020 Cancer Staging   Staging form: Prostate, AJCC 8th Edition - Clinical stage from 03/03/2020: Stage IIB (cT1c, cN0, cM0, PSA: 5.7, Grade Group: 2) - Signed by Freeman Caldron, PA-C on 03/24/2020   03/24/2020 Initial Diagnosis   Malignant neoplasm of prostate ([place])       ICD-10-CM   1. Malignant neoplasm of prostate ([place])  C61     COMPLEX SIMULATION:  The patient presented today for evaluation for possible prostate seed implant. He was brought to the radiation planning suite and placed supine on the CT couch. A 3-dimensional image study set was obtained in upload to the planning computer. There, on each axial slice, I contoured the prostate gland. Then, using three-dimensional radiation planning tools I reconstructed the prostate in view of the structures from the transperineal needle pathway to assess for possible pubic arch interference. In doing so, I did not appreciate any pubic arch interference. Also, the patient's prostate volume was estimated based on the drawn structure. The volume was 50 cc.  Given the pubic arch appearance and prostate volume, patient remains a good candidate to proceed with prostate seed implant. Today, he freely provided informed written consent to proceed.    PLAN: The patient will undergo prostate seed implant.   ________________________________  Sheral Apley. Tammi Klippel, M.D.

## 2020-04-28 DIAGNOSIS — Z8616 Personal history of COVID-19: Secondary | ICD-10-CM

## 2020-04-28 HISTORY — DX: Personal history of COVID-19: Z86.16

## 2020-06-08 ENCOUNTER — Other Ambulatory Visit (HOSPITAL_COMMUNITY)
Admission: RE | Admit: 2020-06-08 | Discharge: 2020-06-08 | Disposition: A | Discharge status: Home / Self Care | Payer: Medicare HMO | Source: Ambulatory Visit | Attending: Urology | Admitting: Urology

## 2020-06-08 ENCOUNTER — Encounter (HOSPITAL_COMMUNITY)
Admission: RE | Admit: 2020-06-08 | Discharge: 2020-06-08 | Disposition: A | Discharge status: Home / Self Care | Payer: Medicare HMO | Source: Ambulatory Visit | Attending: Urology | Admitting: Urology

## 2020-06-08 ENCOUNTER — Other Ambulatory Visit: Payer: Self-pay

## 2020-06-08 ENCOUNTER — Encounter (HOSPITAL_BASED_OUTPATIENT_CLINIC_OR_DEPARTMENT_OTHER): Payer: Self-pay | Admitting: Urology

## 2020-06-08 DIAGNOSIS — Z20822 Contact with and (suspected) exposure to covid-19: Secondary | ICD-10-CM | POA: Insufficient documentation

## 2020-06-08 DIAGNOSIS — Z01812 Encounter for preprocedural laboratory examination: Secondary | ICD-10-CM | POA: Diagnosis present

## 2020-06-08 LAB — COMPREHENSIVE METABOLIC PANEL
ALT: 19 U/L (ref 0–44)
AST: 22 U/L (ref 15–41)
Albumin: 4 g/dL (ref 3.5–5.0)
Alkaline Phosphatase: 58 U/L (ref 38–126)
Anion gap: 13 (ref 5–15)
BUN: 18 mg/dL (ref 8–23)
CO2: 18 mmol/L — ABNORMAL LOW (ref 22–32)
Calcium: 9 mg/dL (ref 8.9–10.3)
Chloride: 107 mmol/L (ref 98–111)
Creatinine, Ser: 1.66 mg/dL — ABNORMAL HIGH (ref 0.61–1.24)
GFR, Estimated: 44 mL/min — ABNORMAL LOW (ref >60)
Glucose, Bld: 108 mg/dL — ABNORMAL HIGH (ref 70–99)
Potassium: 3.7 mmol/L (ref 3.5–5.1)
Sodium: 138 mmol/L (ref 135–145)
Total Bilirubin: 0.8 mg/dL (ref 0.3–1.2)
Total Protein: 7.6 g/dL (ref 6.5–8.1)

## 2020-06-08 LAB — SARS CORONAVIRUS 2 (TAT 6-24 HRS): SARS Coronavirus 2: NEGATIVE

## 2020-06-08 LAB — CBC
HCT: 44.6 % (ref 39.0–52.0)
Hemoglobin: 13.8 g/dL (ref 13.0–17.0)
MCH: 29.5 pg (ref 26.0–34.0)
MCHC: 30.9 g/dL (ref 30.0–36.0)
MCV: 95.3 fL (ref 80.0–100.0)
Platelets: 215 10*3/uL (ref 150–400)
RBC: 4.68 MIL/uL (ref 4.22–5.81)
RDW: 12.8 % (ref 11.5–15.5)
WBC: 6 10*3/uL (ref 4.0–10.5)
nRBC: 0 % (ref 0.0–0.2)

## 2020-06-08 LAB — APTT: aPTT: 28 seconds (ref 24–36)

## 2020-06-08 LAB — PROTIME-INR
INR: 1 (ref 0.8–1.2)
Prothrombin Time: 13.2 seconds (ref 11.4–15.2)

## 2020-06-08 NOTE — Progress Notes (Addendum)
ADDENDUM:  Reviewed chart with anesthesia, Dr. Royetta Car MDA, stated ok for [facility] barring any acute status change dos.   Spoke w/ via phone for pre-op interview--- PT Lab needs dos---- no              Lab results------ pt had CBC, CMP, PT/ PTT done 06-08-2020 results in epic; current elg/ cxr in epic/ chart COVID test ------ done 06-08-2020 result in epic  Arrive at ------- 1100 on 06-11-2020 NPO after MN NO Solid Food.  Clear liquids from MN until--- 1000 Med rec completed Medications to take morning of surgery ----- Lopressor Diabetic medication ----- n/a Patient instructed to bring photo id and insurance card day of surgery Patient aware to have Driver (ride ) / caregiver    for 24 hours after surgery --  Pt states has a friend, Jennell Corner, for drop-off/ pick-up;  Pt verbalized understanding he has to have a caregiver 24 hours after surgery and have  name and number when he checks in per anesthesia guidelines  Patient Special Instructions ----- to do one fleet enema morning of surgery Pre-Op special Istructions ----- n/a Patient verbalized understanding of instructions that were given at this phone interview. Patient denies shortness of breath, chest pain, fever, cough at this phone interview.   Anesthesia Review:  HTN;  CAD w/ hx STEMI 05/ 2019 s/p PCI and DES x2;  AAA;  DM2.  Pt denies cardaic s&s, sob, and no peripheral swelling.  Except when asked about why he stopped his diabetic pill (actose 15mg  qd) approx. Jan or Feb 2022, he stated he was having leg/ chest pain and when he stopped taking it the leg/ chest pain stopped.  He said his pcp is not aware he has stopped he plans on telling pcp at appointment 06-15-2020.  Also, pt does not take ASA 81mg  as per cardiology note he was taking.  Stated he stopped few months ago since it bothered his stomach. Chat to be reviewed by anesthesia.  PCP: Althea Grimmer PA Cardiologist : Bernerd Pho PA (lov 02-19-2020 epic) Chest x-ray :  04-23-2020 epic EKG : 02-19-2020 epic Echo : 05-20-2011 epic Stress test:  no Cardiac Cath : 08-16-2011 epic Activity level: denies sob w/ any activity Sleep Study/ CPAP :  NO Fasting Blood Sugar :      / Checks Blood Sugar -- times a day:  Dose not check Blood Thinner/ Instructions Maryjane Hurter Dose:  NO ASA / Instructions/ Last Dose :  NO

## 2020-06-10 ENCOUNTER — Telehealth: Payer: Self-pay | Admitting: *Deleted

## 2020-06-10 NOTE — Telephone Encounter (Signed)
RETURNED PATIENT'S PHONE CALL, SPOKE WITH PATIENT. ?

## 2020-06-10 NOTE — Telephone Encounter (Signed)
CALLED PATIENT TO REMIND OF PROCEDURE FOR 06-11-20, LVM FOR A RETURN CALL

## 2020-06-10 NOTE — H&P (Signed)
H&P  Chief Complaint: Prostate cancer  History of Present Illness: 70 yo male presents for I 10 brachythearpy for mgmt of PCa.  TRUS/Bx 12.8.2022. Volume 50 mL. 8/12 cores positive--2 w/ GG1, 2 w/ GG2 PCa.  Past Medical History:  Diagnosis Date  . AAA (abdominal aortic aneurysm) ([place])    per cardiology note , ultrasound retroperitoneal 07-22-2019,  5.5cm  . Arthritis   . 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 History of 2019 novel coronavirus disease (COVID-19) 04/2020   per pt positive covid at urgent care but unable to obatin result prior to surgery 06-11-2020;  pt stated had mild symptoms that resolved in a week  . History of ST elevation myocardial infarction (STEMI) 07/2011   inferolateral s/p PCI and stenting  . Hyperlipidemia   . Hypertension   . Prostate cancer [facility]) urologist--- dr Jhonathan Desroches/  oncologist---- dr Tammi Klippel   dx 03-24-2020, Gleason 3+4, Stage T1c, PSA 5.7  . S/P drug eluting coronary stent placement 08/16/2011   PCI and DES x2  diatal RCA and OM1  . Type 2 diabetes mellitus ([place])    followed by pcp per pt ,  (06-08-2020 pt stated was taking actos 15mg  daily, but stopped taking about 4-6 wks ago due to causing leg and chest pain,  stated will tell his pcp at appointment 06-15-2020,  stated he watching is diet)    Past Surgical History:  Procedure Laterality Date  . COLONOSCOPY WITH PROPOFOL N/A 01/18/2018   Procedure: COLONOSCOPY WITH PROPOFOL;  Surgeon: Daneil Dolin, MD;  [facility];  Service: Endoscopy;  Laterality: N/A;  10:15am  . LEFT HEART CATHETERIZATION WITH CORONARY ANGIOGRAM N/A  08/16/2011   Procedure: LEFT HEART CATHETERIZATION WITH CORONARY ANGIOGRAM;  Surgeon: Peter M Martinique, MD;  Location: [facility] CATH LAB;  Service: Cardiovascular;  Laterality: N/A;  . POLYPECTOMY  01/18/2018   Procedure: POLYPECTOMY;  Surgeon: Daneil Dolin, MD;  [facility];  Service: Endoscopy;;  colon  . PROSTATE BIOPSY  03-24-2020  office    Home Medications:  Allergies as of 06/10/2020   No Known Allergies     Medication List    Notice   Cannot display discharge medications because the patient has not yet been admitted.     Allergies: No Known Allergies  Family History  Problem Relation Age of Onset  . Diabetes Brother   . Prostate cancer Brother   . Diabetes Sister   . Hypertension Brother   . Hypertension Sister   . Hyperlipidemia Sister   . Hyperlipidemia Brother   . Colon cancer Neg Hx   . Pancreatic cancer Neg Hx   . Breast cancer Neg Hx     Social History:  reports that he quit smoking about 2 years ago. His smoking use included cigarettes and cigars. He started smoking about 55 years ago. He quit after 15.00 years of use. His smokeless tobacco use includes chew. He reports current alcohol use. He reports that he does not use drugs.  ROS: A complete review of systems was performed.  All systems are negative except for pertinent findings as noted.  Physical  Exam:  Vital signs in last 24 hours: Ht 5\' 9"  (1.753 m)   Wt 117 kg   BMI 38.10 kg/m  Constitutional:  Alert and oriented, No acute distress Cardiovascular: Regular rate  Respiratory: Normal respiratory effort GI: Abdomen is soft, nontender, nondistended, no abdominal masses. No CVAT.  Genitourinary: Normal male phallus, testes are descended bilaterally and non-tender and without masses, scrotum is normal in appearance without lesions or masses, perineum is normal on inspection. Lymphatic: No lymphadenopathy Neurologic: Grossly intact, no focal deficits Psychiatric: Normal mood and  affect  Laboratory Data:  Recent Labs    06/08/20 0927  WBC 6.0  HGB 13.8  HCT 44.6  PLT 215    Recent Labs    06/08/20 0927  NA 138  K 3.7  CL 107  GLUCOSE 108*  BUN 18  CALCIUM 9.0  CREATININE 1.66*     No results found for this or any previous visit (from the past 24 hour(s)). Recent Results (from the past 240 hour(s))  SARS CORONAVIRUS 2 (TAT 6-24 HRS) Nasopharyngeal Nasopharyngeal Swab     Status: None   Collection Time: 06/08/20 11:28 AM   Specimen: Nasopharyngeal Swab  Result Value Ref Range Status   SARS Coronavirus 2 NEGATIVE NEGATIVE Final    Comment: (NOTE) SARS-CoV-2 target nucleic acids are NOT DETECTED.  The SARS-CoV-2 RNA is generally detectable in upper and lower respiratory specimens during the acute phase of infection. Negative results do not preclude SARS-CoV-2 infection, do not rule out co-infections with other pathogens, and should not be used as the sole basis for treatment or other patient management decisions. Negative results must be combined with clinical observations, patient history, and epidemiological information. The expected result is Negative.  Fact Sheet for Patients: SugarRoll.be  Fact Sheet for Healthcare Providers: https://www.woods-mathews.com/  This test is not yet approved or cleared by the [place] FDA and  has been authorized for detection and/or diagnosis of SARS-CoV-2 by FDA under an Emergency Use Authorization (EUA). This EUA will remain  in effect (meaning this test can be used) for the duration of the COVID-19 declaration under Se ction 564(b)(1) of the Act, 21 U.S.C. section 360bbb-3(b)(1), unless the authorization is terminated or revoked sooner.  Performed at [facility], [place] [address] [place], [place] [address]     Renal Function: Recent Labs    06/08/20 7017  CREATININE 1.66*   Estimated Creatinine Clearance: 52.2 mL/min (A) (by C-G formula  based on SCr of 1.66 mg/dL (H)).  Radiologic Imaging: No results found.  Impression/Assessment:  PCa  Plan:  I 125 brachytherapy

## 2020-06-11 ENCOUNTER — Ambulatory Visit (HOSPITAL_COMMUNITY): Payer: Medicare HMO

## 2020-06-11 ENCOUNTER — Ambulatory Visit (HOSPITAL_BASED_OUTPATIENT_CLINIC_OR_DEPARTMENT_OTHER): Payer: Medicare HMO | Admitting: Anesthesiology

## 2020-06-11 ENCOUNTER — Encounter (HOSPITAL_BASED_OUTPATIENT_CLINIC_OR_DEPARTMENT_OTHER)
Admission: RE | Disposition: A | Discharge status: Home / Self Care | Payer: Self-pay | Source: Ambulatory Visit | Attending: Urology

## 2020-06-11 ENCOUNTER — Other Ambulatory Visit: Payer: Self-pay

## 2020-06-11 ENCOUNTER — Ambulatory Visit (HOSPITAL_BASED_OUTPATIENT_CLINIC_OR_DEPARTMENT_OTHER)
Admission: RE | Admit: 2020-06-11 | Discharge: 2020-06-11 | Disposition: A | Discharge status: Home / Self Care | Payer: Medicare HMO | Source: Ambulatory Visit | Attending: Urology | Admitting: Urology

## 2020-06-11 ENCOUNTER — Encounter (HOSPITAL_BASED_OUTPATIENT_CLINIC_OR_DEPARTMENT_OTHER): Payer: Self-pay | Admitting: Urology

## 2020-06-11 DIAGNOSIS — Z8249 Family history of ischemic heart disease and other diseases of the circulatory system: Secondary | ICD-10-CM | POA: Insufficient documentation

## 2020-06-11 DIAGNOSIS — Z87891 Personal history of nicotine dependence: Secondary | ICD-10-CM | POA: Diagnosis not present

## 2020-06-11 DIAGNOSIS — I714 Abdominal aortic aneurysm, without rupture: Secondary | ICD-10-CM | POA: Insufficient documentation

## 2020-06-11 DIAGNOSIS — I252 Old myocardial infarction: Secondary | ICD-10-CM | POA: Diagnosis not present

## 2020-06-11 DIAGNOSIS — Z8616 Personal history of COVID-19: Secondary | ICD-10-CM | POA: Insufficient documentation

## 2020-06-11 DIAGNOSIS — C61 Malignant neoplasm of prostate: Secondary | ICD-10-CM | POA: Diagnosis present

## 2020-06-11 DIAGNOSIS — I2510 Atherosclerotic heart disease of native coronary artery without angina pectoris: Secondary | ICD-10-CM | POA: Diagnosis not present

## 2020-06-11 DIAGNOSIS — Z833 Family history of diabetes mellitus: Secondary | ICD-10-CM | POA: Insufficient documentation

## 2020-06-11 DIAGNOSIS — E785 Hyperlipidemia, unspecified: Secondary | ICD-10-CM | POA: Diagnosis not present

## 2020-06-11 DIAGNOSIS — Z955 Presence of coronary angioplasty implant and graft: Secondary | ICD-10-CM | POA: Insufficient documentation

## 2020-06-11 DIAGNOSIS — E119 Type 2 diabetes mellitus without complications: Secondary | ICD-10-CM | POA: Diagnosis not present

## 2020-06-11 DIAGNOSIS — I10 Essential (primary) hypertension: Secondary | ICD-10-CM | POA: Diagnosis not present

## 2020-06-11 HISTORY — DX: Abdominal aortic aneurysm, without rupture, unspecified: I71.40

## 2020-06-11 HISTORY — DX: Hyperlipidemia, unspecified: E78.5

## 2020-06-11 HISTORY — PX: SPACE OAR INSTILLATION: SHX6769

## 2020-06-11 HISTORY — PX: RADIOACTIVE SEED IMPLANT: SHX5150

## 2020-06-11 HISTORY — DX: Unspecified osteoarthritis, unspecified site: M19.90

## 2020-06-11 HISTORY — DX: Type 2 diabetes mellitus without complications: E11.9

## 2020-06-11 HISTORY — PX: CYSTOSCOPY: SHX5120

## 2020-06-11 HISTORY — DX: Abdominal aortic aneurysm, without rupture: I71.4

## 2020-06-11 LAB — GLUCOSE, CAPILLARY
Glucose-Capillary: 102 mg/dL — ABNORMAL HIGH (ref 70–99)
Glucose-Capillary: 106 mg/dL — ABNORMAL HIGH (ref 70–99)

## 2020-06-11 SURGERY — INSERTION, RADIATION SOURCE, PROSTATE
Anesthesia: General | Site: Rectum

## 2020-06-11 MED ORDER — SODIUM CHLORIDE 0.9 % IR SOLN
Status: DC | PRN
  Administered 2020-06-11: 1000 mL via INTRAVESICAL

## 2020-06-11 MED ORDER — CEFAZOLIN SODIUM-DEXTROSE 2-4 GM/100ML-% IV SOLN
2.0000 g | Freq: Once | INTRAVENOUS | Status: AC
  Administered 2020-06-11: 2 g via INTRAVENOUS

## 2020-06-11 MED ORDER — PROPOFOL 10 MG/ML IV BOLUS
INTRAVENOUS | Status: DC | PRN
  Administered 2020-06-11: 150 mg via INTRAVENOUS

## 2020-06-11 MED ORDER — FENTANYL CITRATE (PF) 100 MCG/2ML IJ SOLN: 25.0000 ug | INTRAMUSCULAR | Status: DC | PRN

## 2020-06-11 MED ORDER — LACTATED RINGERS IV SOLN: INTRAVENOUS | Status: DC

## 2020-06-11 MED ORDER — ONDANSETRON HCL 4 MG/2ML IJ SOLN
INTRAMUSCULAR | Status: AC
  Filled 2020-06-11: qty 2

## 2020-06-11 MED ORDER — AMISULPRIDE (ANTIEMETIC) 5 MG/2ML IV SOLN
10.0000 mg | Freq: Once | INTRAVENOUS | Status: AC | PRN
  Administered 2020-06-11: 10 mg via INTRAVENOUS

## 2020-06-11 MED ORDER — PHENYLEPHRINE 40 MCG/ML (10ML) SYRINGE FOR IV PUSH (FOR BLOOD PRESSURE SUPPORT)
PREFILLED_SYRINGE | INTRAVENOUS | Status: DC | PRN
  Administered 2020-06-11: 40 ug via INTRAVENOUS
  Administered 2020-06-11: 80 ug via INTRAVENOUS
  Administered 2020-06-11 (×4): 40 ug via INTRAVENOUS

## 2020-06-11 MED ORDER — DEXAMETHASONE SODIUM PHOSPHATE 10 MG/ML IJ SOLN
INTRAMUSCULAR | Status: AC
  Filled 2020-06-11: qty 1

## 2020-06-11 MED ORDER — FENTANYL CITRATE (PF) 100 MCG/2ML IJ SOLN
INTRAMUSCULAR | Status: DC | PRN
  Administered 2020-06-11: 50 ug via INTRAVENOUS
  Administered 2020-06-11 (×2): 25 ug via INTRAVENOUS

## 2020-06-11 MED ORDER — OXYCODONE HCL 5 MG PO TABS
5.0000 mg | ORAL_TABLET | Freq: Once | ORAL | Status: DC | PRN
Start: 2020-06-11 — End: 2020-06-11

## 2020-06-11 MED ORDER — EPHEDRINE 5 MG/ML INJ
INTRAVENOUS | Status: AC
  Filled 2020-06-11: qty 10

## 2020-06-11 MED ORDER — ONDANSETRON HCL 4 MG/2ML IJ SOLN: 4.0000 mg | Freq: Once | INTRAMUSCULAR | Status: DC | PRN

## 2020-06-11 MED ORDER — LIDOCAINE 2% (20 MG/ML) 5 ML SYRINGE
INTRAMUSCULAR | Status: DC | PRN
  Administered 2020-06-11: 100 mg via INTRAVENOUS

## 2020-06-11 MED ORDER — LIDOCAINE 2% (20 MG/ML) 5 ML SYRINGE
INTRAMUSCULAR | Status: AC
  Filled 2020-06-11: qty 5

## 2020-06-11 MED ORDER — EPHEDRINE SULFATE-NACL 50-0.9 MG/10ML-% IV SOSY
PREFILLED_SYRINGE | INTRAVENOUS | Status: DC | PRN
  Administered 2020-06-11: 5 mg via INTRAVENOUS

## 2020-06-11 MED ORDER — ONDANSETRON HCL 4 MG/2ML IJ SOLN
INTRAMUSCULAR | Status: DC | PRN
  Administered 2020-06-11: 4 mg via INTRAVENOUS

## 2020-06-11 MED ORDER — TRAMADOL HCL 50 MG PO TABS: 50.0000 mg | ORAL_TABLET | Freq: Four times a day (QID) | ORAL | 0 refills | Status: DC | PRN

## 2020-06-11 MED ORDER — DEXAMETHASONE SODIUM PHOSPHATE 10 MG/ML IJ SOLN
INTRAMUSCULAR | Status: DC | PRN
  Administered 2020-06-11: 5 mg via INTRAVENOUS

## 2020-06-11 MED ORDER — OXYCODONE HCL 5 MG/5ML PO SOLN
5.0000 mg | Freq: Once | ORAL | Status: DC | PRN
Start: 2020-06-11 — End: 2020-06-11

## 2020-06-11 MED ORDER — IOHEXOL 300 MG/ML  SOLN
INTRAMUSCULAR | Status: DC | PRN
  Administered 2020-06-11: 7 mL

## 2020-06-11 MED ORDER — AMISULPRIDE (ANTIEMETIC) 5 MG/2ML IV SOLN
INTRAVENOUS | Status: AC
  Filled 2020-06-11: qty 4

## 2020-06-11 MED ORDER — PHENYLEPHRINE 40 MCG/ML (10ML) SYRINGE FOR IV PUSH (FOR BLOOD PRESSURE SUPPORT)
PREFILLED_SYRINGE | INTRAVENOUS | Status: AC
  Filled 2020-06-11: qty 30

## 2020-06-11 MED ORDER — ACETAMINOPHEN 500 MG PO TABS
ORAL_TABLET | ORAL | Status: AC
  Filled 2020-06-11: qty 2

## 2020-06-11 MED ORDER — CEFAZOLIN SODIUM-DEXTROSE 2-4 GM/100ML-% IV SOLN
INTRAVENOUS | Status: AC
  Filled 2020-06-11: qty 100

## 2020-06-11 MED ORDER — FENTANYL CITRATE (PF) 100 MCG/2ML IJ SOLN
INTRAMUSCULAR | Status: AC
  Filled 2020-06-11: qty 2

## 2020-06-11 MED ORDER — CIPROFLOXACIN HCL 250 MG PO TABS: 250.0000 mg | ORAL_TABLET | Freq: Two times a day (BID) | ORAL | 0 refills | Status: DC

## 2020-06-11 MED ORDER — SODIUM CHLORIDE (PF) 0.9 % IJ SOLN
INTRAMUSCULAR | Status: DC | PRN
  Administered 2020-06-11: 10 mL

## 2020-06-11 MED ORDER — FLEET ENEMA 7-19 GM/118ML RE ENEM: 1.0000 | ENEMA | Freq: Once | RECTAL | Status: DC

## 2020-06-11 MED ORDER — ACETAMINOPHEN 500 MG PO TABS
1000.0000 mg | ORAL_TABLET | Freq: Once | ORAL | Status: AC
  Administered 2020-06-11: 1000 mg via ORAL

## 2020-06-11 SURGICAL SUPPLY — 37 items
BAG DRN RND TRDRP ANRFLXCHMBR (UROLOGICAL SUPPLIES) ×3
BAG URINE DRAIN 2000ML AR STRL (UROLOGICAL SUPPLIES) ×4 IMPLANT
BLADE CLIPPER SENSICLIP SURGIC (BLADE) ×4 IMPLANT
CATH FOLEY 2WAY SLVR  5CC 16FR (CATHETERS) ×1
CATH FOLEY 2WAY SLVR 5CC 16FR (CATHETERS) ×3 IMPLANT
CATH ROBINSON RED A/P 16FR (CATHETERS) IMPLANT
CATH ROBINSON RED A/P 20FR (CATHETERS) ×4 IMPLANT
CLOTH BEACON ORANGE TIMEOUT ST (SAFETY) ×4 IMPLANT
CNTNR URN SCR LID CUP LEK RST (MISCELLANEOUS) ×6 IMPLANT
CONT SPEC 4OZ STRL OR WHT (MISCELLANEOUS) ×8
COVER BACK TABLE 60X90IN (DRAPES) ×4 IMPLANT
COVER MAYO STAND STRL (DRAPES) ×4 IMPLANT
DRAPE C-ARM 35X43 STRL (DRAPES) ×4 IMPLANT
DRSG TEGADERM 4X4.75 (GAUZE/BANDAGES/DRESSINGS) ×4 IMPLANT
DRSG TEGADERM 8X12 (GAUZE/BANDAGES/DRESSINGS) ×8 IMPLANT
GAUZE SPONGE 4X4 12PLY STRL (GAUZE/BANDAGES/DRESSINGS) ×4 IMPLANT
GLOVE SURG ENC MOIS LTX SZ6.5 (GLOVE) ×4 IMPLANT
GLOVE SURG ENC MOIS LTX SZ7.5 (GLOVE) IMPLANT
GLOVE SURG ENC MOIS LTX SZ8 (GLOVE) ×8 IMPLANT
GLOVE SURG ORTHO LTX SZ8.5 (GLOVE) ×4 IMPLANT
GLOVE SURG POLYISO LF SZ6.5 (GLOVE) IMPLANT
GLOVE SURG UNDER POLY LF SZ7.5 (GLOVE) ×8 IMPLANT
GOWN STRL REUS W/TWL XL LVL3 (GOWN DISPOSABLE) ×4 IMPLANT
HOLDER FOLEY CATH W/STRAP (MISCELLANEOUS) ×4 IMPLANT
I-SEED AGX100 ×268 IMPLANT
IMPL SPACEOAR VUE SYSTEM (Spacer) ×3 IMPLANT
IMPLANT SPACEOAR VUE SYSTEM (Spacer) ×4 IMPLANT
IV NS 1000ML (IV SOLUTION) ×4
IV NS 1000ML BAXH (IV SOLUTION) ×3 IMPLANT
KIT TURNOVER CYSTO (KITS) ×4 IMPLANT
MARKER SKIN DUAL TIP RULER LAB (MISCELLANEOUS) ×4 IMPLANT
PACK CYSTO (CUSTOM PROCEDURE TRAY) ×4 IMPLANT
SUT BONE WAX W31G (SUTURE) IMPLANT
SYR 10ML LL (SYRINGE) ×4 IMPLANT
TOWEL OR 17X26 10 PK STRL BLUE (TOWEL DISPOSABLE) ×4 IMPLANT
UNDERPAD 30X36 HEAVY ABSORB (UNDERPADS AND DIAPERS) ×8 IMPLANT
WATER STERILE IRR 500ML POUR (IV SOLUTION) ×4 IMPLANT

## 2020-06-11 NOTE — Anesthesia Postprocedure Evaluation (Signed)
Anesthesia Post Note  Patient: Manuel Nguyen  Procedure(s) Performed: RADIOACTIVE SEED IMPLANT/BRACHYTHERAPY IMPLANT (N/A Prostate) SPACE OAR INSTILLATION (N/A Rectum) CYSTOSCOPY FLEXIBLE (Bladder)     Patient location during evaluation: PACU Anesthesia Type: General Level of consciousness: awake and alert Pain management: pain level controlled Vital Signs Assessment: post-procedure vital signs reviewed and stable Respiratory status: spontaneous breathing, nonlabored ventilation, respiratory function stable and patient connected to nasal cannula oxygen Cardiovascular status: blood pressure returned to baseline and stable Postop Assessment: no apparent nausea or vomiting Anesthetic complications: no   No complications documented.  Last Vitals:  Vitals:   06/11/20 1600 06/11/20 1624  BP: (!) 144/84 (!) 176/92  Pulse: 81 75  Resp: 20 16  Temp:  36.6 C  SpO2: 100% 98%    Last Pain:  Vitals:   06/11/20 1624  TempSrc:   PainSc: 0-No pain                 Rakan Soffer

## 2020-06-11 NOTE — Op Note (Signed)
Preoperative diagnosis: Clinical stage TI C adenocarcinoma the prostate   Postoperative diagnosis: Same   Procedure: I-125 prostate seed implantation, SpaceOAR placement, flexible cystoscopy  Surgeon: [name]. Dahlstedt M.D.  Radiation Oncologist: Tyler Pita, M.D.  Anesthesia: Gen.   Indications: Patient  was diagnosed with clinical stage TI C prostate cancer. We had extensive discussion with him about treatment options versus. He elected to proceed with seed implantation. He underwent consultation my office as well as with Dr. Tammi Klippel. He appeared to understand the advantages disadvantages potential risks of this treatment option. Full informed consent has been obtained.   Technique and findings: Patient was brought the operating room where he had successful induction of general anesthesia. He was placed in dorso-lithotomy position and prepped and draped in usual manner. Appropriate surgical timeout was performed. Radiation oncology department placed a transrectal ultrasound probe anchoring stand. Foley catheter with contrast in the balloon was inserted without difficulty. Anchoring needles were placed within the prostate. Rectal tube was placed. Real-time contouring of the urethra prostate and rectum were performed and the dosing parameters were established. Targeted dose was 145 gray.  I was then called  to the operating suite suite for placement of the needles. A second timeout was performed. All needle passage was done with real-time transrectal ultrasound guidance with the sagittal plane. A total of 63 needles were placed.  20 active seeds were implanted.  I then proceeded with placement of SpaceOAR by introducing a needle with the bevel angled inferiorly approximately 2 cm superior to the anus. This was angled downward and under direct ultrasound was placed within the space between the prostatic capsule and rectum. This was confirmed with a small amount of sterile saline injected and  this was performed under direct ultrasound. I then attached the SpaceOAR to the needle and injected this in the space between the prostate and rectum with good placement noted. The Foley catheter was removed and flexible cystoscopy failed to show any seeds outside the prostate.  The patient was brought to recovery room in stable condition, having tolerated the procedure well.Marland Kitchen

## 2020-06-11 NOTE — Anesthesia Procedure Notes (Signed)
Procedure Name: LMA Insertion Date/Time: 06/11/2020 12:44 PM Performed by: Gwyndolyn Saxon, CRNA Pre-anesthesia Checklist: Patient identified, Emergency Drugs available, Suction available and Patient being monitored Patient Re-evaluated:Patient Re-evaluated prior to induction Oxygen Delivery Method: Circle system utilized Preoxygenation: Pre-oxygenation with 100% oxygen Induction Type: IV induction Ventilation: Mask ventilation without difficulty LMA: LMA inserted LMA Size: 5.0 Number of attempts: 1 Placement Confirmation: positive ETCO2 and breath sounds checked- equal and bilateral Tube secured with: Tape Dental Injury: Teeth and Oropharynx as per pre-operative assessment

## 2020-06-11 NOTE — Interval H&P Note (Signed)
History and Physical Interval Note:  06/11/2020 1:26 PM  Manuel Nguyen  has presented today for surgery, with the diagnosis of PROSTATE CANCER.  The various methods of treatment have been discussed with the patient and family. After consideration of risks, benefits and other options for treatment, the patient has consented to  Procedure(s) with comments: RADIOACTIVE SEED IMPLANT/BRACHYTHERAPY IMPLANT (N/A) -      SEEDS IMPLANTED SPACE OAR INSTILLATION (N/A) CYSTOSCOPY FLEXIBLE - NO SEEDS FOUND IN BLADDER as a surgical intervention.  The patient's history has been reviewed, patient examined, no change in status, stable for surgery.  I have reviewed the patient's chart and labs.  Questions were answered to the patient's satisfaction.     [name] [name]

## 2020-06-11 NOTE — Anesthesia Preprocedure Evaluation (Addendum)
Anesthesia Evaluation  Patient identified by MRN, date of birth, ID band Patient awake    Reviewed: Allergy & Precautions, NPO status , Patient's Chart, lab work & pertinent test results  Airway Mallampati: I  TM Distance: >3 FB Neck ROM: Full    Dental no notable dental hx. (+) Edentulous Upper, Edentulous Lower   Pulmonary former smoker,    Pulmonary exam normal breath sounds clear to auscultation       Cardiovascular hypertension, + CAD, + Past MI and + Cardiac Stents (DES 2013 on ASA 81mg )  Normal cardiovascular exam Rhythm:Regular Rate:Normal     Neuro/Psych negative neurological ROS  negative psych ROS   GI/Hepatic negative GI ROS, Neg liver ROS,   Endo/Other  diabetes, Type 2, Oral Hypoglycemic Agents  Renal/GU Renal InsufficiencyRenal disease   Prostate cancer negative genitourinary   Musculoskeletal negative musculoskeletal ROS (+) Arthritis ,   Abdominal   Peds negative pediatric ROS (+)  Hematology negative hematology ROS (+)   Anesthesia Other Findings   Reproductive/Obstetrics negative OB ROS                            Anesthesia Physical Anesthesia Plan  ASA: III  Anesthesia Plan: General   Post-op Pain Management:    Induction: Intravenous  PONV Risk Score and Plan: 2 and Ondansetron and Treatment may vary due to age or medical condition  Airway Management Planned: LMA  Additional Equipment: None  Intra-op Plan:   Post-operative Plan: Extubation in OR  Informed Consent:   Plan Discussed with: Anesthesiologist and CRNA  Anesthesia Plan Comments:        Anesthesia Quick Evaluation

## 2020-06-11 NOTE — Transfer of Care (Signed)
Immediate Anesthesia Transfer of Care Note  Patient: Manuel Nguyen  Procedure(s) Performed: RADIOACTIVE SEED IMPLANT/BRACHYTHERAPY IMPLANT (N/A Prostate) SPACE OAR INSTILLATION (N/A Rectum) CYSTOSCOPY FLEXIBLE (Bladder)  Patient Location: PACU  Anesthesia Type:General  Level of Consciousness: awake, alert , oriented and patient cooperative  Airway & Oxygen Therapy: Patient Spontanous Breathing  Post-op Assessment: Report given to RN and Post -op Vital signs reviewed and stable  Post vital signs: Reviewed and stable  Last Vitals:  Vitals Value Taken Time  BP    Temp    Pulse 73 06/11/20 1522  Resp 15 06/11/20 1522  SpO2 97 % 06/11/20 1522  Vitals shown include unvalidated device data.  Last Pain:  Vitals:   06/11/20 1124  TempSrc: Oral  PainSc: 0-No pain      Patients Stated Pain Goal: 5 (87/27/61 8485)  Complications: No complications documented.

## 2020-06-11 NOTE — Discharge Instructions (Signed)
Radioactive Seed Implant Home Care Instructions ° ° °Activity:    Rest for the remainder of the day.  Do not drive or operate equipment today.  You may resume normal  activities in a few days as instructed by your physician, without risk of harmful radiation exposure to those around you, provided you follow the time and distance precautions on the Radiation Oncology Instruction Sheet. ° ° °Meals: Drink plenty of lipuids and eat light foods, such as gelatin or soup this evening .  You may return to normal meal plan tomorrow. ° °Return °To Work: You may return to work as instructed by your physician. ° °Special °Instruction:   If any seeds are found, use tweezers to pick up seeds and place in a glass container of any kind and bring to your physician's office. ° °Call your physician if any of these symptoms occur: ° °· Persistent or heavy bleeding °· Urine stream diminishes or stops completely after catheter is removed °· Fever equal to or greater than 101 degrees F °· Cloudy urine with a strong foul odor °· Severe pain ° °You may feel some burning pain and/or hesitancy when you urinate after the catheter is removed.  These symptoms may increase over the next few weeks, but should diminish within forur to six weeks.  Applying moist heat to the lower abdomen or a hot tub bath may help relieve the pain.  If the discomfort becomes severe, please call your physician for additional medications. ° ° °Post Anesthesia Home Care Instructions ° °Activity: °Get plenty of rest for the remainder of the day. A responsible individual must stay with you for 24 hours following the procedure.  °For the next 24 hours, DO NOT: °-Drive a car °-Operate machinery °-Drink alcoholic beverages °-Take any medication unless instructed by your physician °-Make any legal decisions or sign important papers. ° °Meals: °Start with liquid foods such as gelatin or soup. Progress to regular foods as tolerated. Avoid greasy, spicy, heavy foods. If nausea  and/or vomiting occur, drink only clear liquids until the nausea and/or vomiting subsides. Call your physician if vomiting continues. ° °Special Instructions/Symptoms: °Your throat may feel dry or sore from the anesthesia or the breathing tube placed in your throat during surgery. If this causes discomfort, gargle with warm salt water. The discomfort should disappear within 24 hours. ° °   ° ° ° ° ° °

## 2020-06-12 ENCOUNTER — Encounter (HOSPITAL_BASED_OUTPATIENT_CLINIC_OR_DEPARTMENT_OTHER): Payer: Self-pay | Admitting: Urology

## 2020-06-12 NOTE — Progress Notes (Signed)
  Radiation Oncology         [phone] ________________________________  Name: EUSEBIO BLAZEJEWSKI MRN: [id number]  Date: 06/12/2020  DOB: 06-Apr-1950       Prostate Seed Implant  YY:TKPT, Pushpinder K, NP  No ref. provider found  DIAGNOSIS:   70 y.o. gentleman with Stage T1c adenocarcinoma of the prostate with Gleason score of 3+4, and PSA of 5.7 Oncology History  Malignant neoplasm of prostate ([place])  03/03/2020 Cancer Staging   Staging form: Prostate, AJCC 8th Edition - Clinical stage from 03/03/2020: Stage IIB (cT1c, cN0, cM0, PSA: 5.7, Grade Group: 2) - Signed by Freeman Caldron, PA-C on 03/24/2020   03/24/2020 Initial Diagnosis   Malignant neoplasm of prostate ([place])     No diagnosis found.  PROCEDURE: Insertion of radioactive I-125 seeds into the prostate gland.  RADIATION DOSE: 145 Gy, definitive therapy.  TECHNIQUE: Whittaker DUANNE DUCHESNE was brought to the operating room with the urologist. He was placed in the dorsolithotomy position. He was catheterized and a rectal tube was inserted. The perineum was shaved, prepped and draped. The ultrasound probe was then introduced into the rectum to see the prostate gland.  TREATMENT DEVICE: A needle grid was attached to the ultrasound probe stand and anchor needles were placed.  3D PLANNING: The prostate was imaged in 3D using a sagittal sweep of the prostate probe. These images were transferred to the planning computer. There, the prostate, urethra and rectum were defined on each axial reconstructed image. Then, the software created an optimized 3D plan and a few seed positions were adjusted. The quality of the plan was reviewed using [facility] information for the target and the following two organs at risk:  Urethra and Rectum.  Then the accepted plan was printed and handed off to the radiation therapist.  Under my supervision, the custom loading of the seeds and spacers was carried out and loaded into sealed vicryl sleeves.  These pre-loaded needles  were then placed into the needle holder.Marland Kitchen  PROSTATE VOLUME STUDY:  Using transrectal ultrasound the volume of the prostate was verified to be 50.3 cc.  SPECIAL TREATMENT PROCEDURE/SUPERVISION AND HANDLING: The pre-loaded needles were then delivered under sagittal guidance. A total of 20 needles were used to deposit 67 seeds in the prostate gland. The individual seed activity was 0.530 mCi.  SpaceOAR:  Yes  COMPLEX SIMULATION: At the end of the procedure, an anterior radiograph of the pelvis was obtained to document seed positioning and count. Cystoscopy was performed to check the urethra and bladder.  MICRODOSIMETRY: At the end of the procedure, the patient was emitting 0.088 mR/hr at 1 meter. Accordingly, he was considered safe for hospital discharge.  PLAN: The patient will return to the radiation oncology clinic for post implant CT dosimetry in three weeks.   ________________________________  Sheral Apley Tammi Klippel, M.D.

## 2020-07-14 ENCOUNTER — Telehealth: Payer: Self-pay | Admitting: *Deleted

## 2020-07-14 NOTE — Telephone Encounter (Signed)
RETURNED PATIENT'S PHONE CALL, [place], DUE TO VM BEING SET UP, WILL CALL LATER

## 2020-07-14 NOTE — Telephone Encounter (Signed)
CALLED PATIENT TO REMIND OF POST SEED APPTS. FOR 07-15-20, LVM FOR A RETURN CALL

## 2020-07-15 ENCOUNTER — Ambulatory Visit: Payer: Medicare HMO | Admitting: Radiation Oncology

## 2020-07-15 ENCOUNTER — Ambulatory Visit: Payer: Medicare HMO | Admitting: Urology

## 2020-07-16 ENCOUNTER — Telehealth: Payer: Self-pay | Admitting: *Deleted

## 2020-07-16 NOTE — Telephone Encounter (Signed)
Called patient to inform of new post seed appts, unable to leave message due to vm not being set up, mailed appt. card

## 2020-07-16 NOTE — Telephone Encounter (Signed)
RETURNED PATIENT'S PHONE CALL, UNABLE TO LEAVE MESSAGE DUE TO VM NOT BEING SET UP

## 2020-07-21 ENCOUNTER — Ambulatory Visit: Payer: Medicare HMO | Admitting: Urology

## 2020-07-21 ENCOUNTER — Telehealth: Payer: Self-pay | Admitting: *Deleted

## 2020-07-21 NOTE — Telephone Encounter (Signed)
CALLED PATIENT TO REMIND OF POST SEED APPTS. FOR 07-22-20, SPOKE WITH PATIENT AND HE IS AWARE OF THESE APPTS.

## 2020-07-22 ENCOUNTER — Encounter: Payer: Self-pay | Admitting: Urology

## 2020-07-22 ENCOUNTER — Other Ambulatory Visit: Payer: Self-pay

## 2020-07-22 ENCOUNTER — Ambulatory Visit
Admission: RE | Admit: 2020-07-22 | Discharge: 2020-07-22 | Disposition: A | Discharge status: Home / Self Care | Payer: Medicare HMO | Source: Ambulatory Visit | Attending: Urology | Admitting: Urology

## 2020-07-22 ENCOUNTER — Ambulatory Visit
Admission: RE | Admit: 2020-07-22 | Discharge: 2020-07-22 | Disposition: A | Discharge status: Home / Self Care | Payer: Medicare HMO | Source: Ambulatory Visit | Attending: Radiation Oncology | Admitting: Radiation Oncology

## 2020-07-22 ENCOUNTER — Ambulatory Visit: Payer: Medicare HMO | Admitting: Radiation Oncology

## 2020-07-22 ENCOUNTER — Ambulatory Visit: Payer: Self-pay | Admitting: Urology

## 2020-07-22 VITALS — BP 136/84 | HR 69 | Temp 97.5°F | Resp 20 | Ht 69.0 in | Wt 214.0 lb

## 2020-07-22 DIAGNOSIS — C61 Malignant neoplasm of prostate: Secondary | ICD-10-CM | POA: Insufficient documentation

## 2020-07-22 MED ORDER — TAMSULOSIN HCL 0.4 MG PO CAPS: 0.4000 mg | ORAL_CAPSULE | Freq: Every day | ORAL | 2 refills | Status: DC

## 2020-07-22 NOTE — Progress Notes (Signed)
Radiation Oncology         [phone] ________________________________  Name: Manuel Nguyen MRN: [id number]  Date: 07/22/2020  DOB: 1950-05-18  Post-Seed Follow-Up Visit Note  CC: [name], NP  [name], MD  Diagnosis:   70 y.o. gentleman with Stage T1c adenocarcinoma of the prostate with Gleason score of 3+4, and PSA of 5.7    ICD-10-CM   1. Malignant neoplasm of prostate ([place])  C61     Interval Since Last Radiation:  6 weeks 06/11/20:  Insertion of radioactive I-125 seeds into the prostate gland; 145 Gy, definitive therapy with placement of SpaceOAR gel.  Narrative:  The patient returns today for routine follow-up.  He is complaining of increased urinary frequency and urinary hesitation symptoms. He filled out a questionnaire regarding urinary function today providing and overall IPSS score of 18 characterizing his symptoms as moderate-severe with nocturia x4, weak flow, hesitancy, intermittency and urgency.  He specifically denies dysuria, gross hematuria, straining to void or incontinence.  His pre-implant score was 4.  He reports a healthy appetite and is maintaining his weight.  He denies any abdominal pain or bowel symptoms.  He has not noticed any significant impact on his energy level and overall, is quite pleased with his progress to date.  ALLERGIES:  has No Known Allergies.  Meds: Current Outpatient Medications  Medication Sig Dispense Refill  . aspirin EC 81 MG tablet Take 81 mg by mouth daily. Swallow whole.    . Cholecalciferol 25 MCG (1000 UT) capsule Take 1,000 Units by mouth daily.    . ciprofloxacin (CIPRO) 250 MG tablet Take 1 tablet (250 mg total) by mouth 2 (two) times daily. 10 tablet 0  . Evolocumab (REPATHA SURECLICK) 322 MG/ML SOAJ Inject 140 mg into the skin every 14 (fourteen) days. 2 pen 11  . metoprolol (LOPRESSOR) 50 MG tablet Take 1 tablet (50 mg total) by mouth 2 (two) times daily. (Patient taking differently: Take 50 mg by mouth  daily.) 60 tablet 6  . pioglitazone (ACTOS) 15 MG tablet Take 15 mg by mouth daily.    . sildenafil (VIAGRA) 100 MG tablet Take 1 tablet (100 mg total) by mouth daily as needed for erectile dysfunction. Take 1/2-1 po prn (Patient taking differently: Take 100 mg by mouth daily as needed for erectile dysfunction. Take 1/2-1 po prn) 20 tablet prn  . traMADol (ULTRAM) 50 MG tablet Take 1 tablet (50 mg total) by mouth every 6 (six) hours as needed. 10 tablet 0  . valsartan (DIOVAN) 160 MG tablet Take 160 mg by mouth daily.     No current facility-administered medications for this visit.    Physical Findings: In general this is a well appearing African-American male in no acute distress. He's alert and oriented x4 and appropriate throughout the examination. Cardiopulmonary assessment is negative for acute distress and he exhibits normal effort.   Lab Findings: Lab Results  Component Value Date   WBC 6.0 06/08/2020   HGB 13.8 06/08/2020   HCT 44.6 06/08/2020   MCV 95.3 06/08/2020   PLT 215 06/08/2020    Radiographic Findings:  Patient underwent CT imaging in our clinic for post implant dosimetry. The CT will be reviewed by Dr. Tammi Klippel to confirm there is an adequate distribution of radioactive seeds throughout the prostate gland and ensure that there are no seeds in or near the rectum. We suspect the final radiation plan and dosimetry will show appropriate coverage of the prostate gland. He understands that  we will call and inform him of any unexpected findings on further review of his imaging and dosimetry.  Impression/Plan: 70 y.o. gentleman with Stage T1c adenocarcinoma of the prostate with Gleason score of 3+4, and PSA of 5.7 The patient is recovering from the effects of radiation. His urinary symptoms should gradually improve over the next 4-6 months. We talked about this today. He is encouraged by his improvement already and is otherwise pleased with his outcome.  He is interested in trying  a trial of alpha-blocker to help better manage the nocturia so I will send a prescription of Flomax to his pharmacy.  We also talked about long-term follow-up for prostate cancer following seed implant. He understands that ongoing PSA determinations and digital rectal exams will help perform surveillance to rule out disease recurrence. He has a follow up appointment scheduled with Dr. Diona Fanti in June 2022. He understands what to expect with his PSA measures. Patient was also educated today about some of the long-term effects from radiation including a small risk for rectal bleeding and possibly erectile dysfunction. We talked about some of the general management approaches to these potential complications. However, I did encourage the patient to contact our office or return at any point if he has questions or concerns related to his previous radiation and prostate cancer.    Manuel Johns, PA-C

## 2020-07-22 NOTE — Progress Notes (Signed)
Patient here today for a post seed appointment. Patient states nocturia 4 times per night. Patient states mild dysuria. Patient states occasional hematuria. Patient states some leakage of urine. Patient denies having fevers. Patient states urine stream is moderate that is mostly steady with some stopping and starting. Patient states that he does strain to start his urine flow. Patient states that he is not emptying his bladder completely and returning to the bathroom less than 30 minutes later. Patient states urgency and can hold his urine.

## 2020-07-22 NOTE — Progress Notes (Signed)
  Radiation Oncology         [phone] ________________________________  Name: Manuel Nguyen MRN: [id number]  Date: 07/22/2020  DOB: [birth year]  COMPLEX SIMULATION NOTE  NARRATIVE:  The patient was brought to the [facility] today following prostate seed implantation approximately one month ago.  Identity was confirmed.  All relevant records and images related to the planned course of therapy were reviewed.  Then, the patient was set-up supine.  CT images were obtained.  The CT images were loaded into the planning software.  Then the prostate and rectum were contoured.  Treatment planning then occurred.  The implanted iodine 125 seeds were identified by the physics staff for projection of radiation distribution  I have requested : 3D Simulation  I have requested a DVH of the following structures: Prostate and rectum.    ________________________________  Sheral Apley Tammi Klippel, M.D.

## 2020-09-01 ENCOUNTER — Encounter: Payer: Self-pay | Admitting: Urology

## 2020-09-01 ENCOUNTER — Ambulatory Visit (INDEPENDENT_AMBULATORY_CARE_PROVIDER_SITE_OTHER): Payer: Medicare HMO | Admitting: Urology

## 2020-09-01 ENCOUNTER — Other Ambulatory Visit: Payer: Self-pay

## 2020-09-01 VITALS — BP 131/74 | HR 87

## 2020-09-01 DIAGNOSIS — C61 Malignant neoplasm of prostate: Secondary | ICD-10-CM

## 2020-09-01 LAB — MICROSCOPIC EXAMINATION
Bacteria, UA: NONE SEEN
Epithelial Cells (non renal): NONE SEEN /hpf (ref 0–10)
Renal Epithel, UA: NONE SEEN /hpf

## 2020-09-01 LAB — URINALYSIS, ROUTINE W REFLEX MICROSCOPIC
Bilirubin, UA: NEGATIVE
Glucose, UA: NEGATIVE
Ketones, UA: NEGATIVE
Nitrite, UA: NEGATIVE
Protein,UA: NEGATIVE
Specific Gravity, UA: 1.02 (ref 1.005–1.030)
Urobilinogen, Ur: 0.2 mg/dL (ref 0.2–1.0)
pH, UA: 6 (ref 5.0–7.5)

## 2020-09-01 NOTE — Progress Notes (Signed)
History of Present Illness: Followup of prostate cancer  12.7.2021: TRUS/Bx. prostate volume 49.76 mL. 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 6 cores w/ GS 3+3 pattern-- Lt mid lateral 5% of core, Lt apex lateral 30% of core,  Rt apex medial, 5% of core,  Rt apex lateral, 20% of core, Rt mid lateral, 5% of core, Rt base lateral, 10% of core 2 cores revealed GS 3+4 pattern --Rt mid medial, 10% of core,  Lt base lateral, 10% of core  3.17.2022: Underwent I 1225 brachytherapy and SpaceOAR placement.  6.7.2022: He has had slight worsening of urination since his treatment.  He had mild dysuria for a while.  He is on tamsulosin still.  Current IPSS 19, quality-of-life score 6.  IPSS Questionnaire (AUA-7): Over the past month.   1)  How often have you had a sensation of not emptying your bladder completely after you finish urinating?  4 - More than half the time  2)  How often have you had to urinate again less than two hours after you finished urinating? 3 - About half the time  3)  How often have you found you stopped and started again several times when you urinated?  2 - Less than half the time  4) How difficult have you found it to postpone urination?  1 - Less than 1 time in 5  5) How often have you had a weak urinary stream?  3 - About half the time  6) How often have you had to push or strain to begin urination?  3 - About half the time  7) How many times did you most typically get up to urinate from the time you went to bed until the time you got up in the morning?  3 - 3 times  Total score:  0-7 mildly symptomatic   8-19 moderately symptomatic   20-35 severely symptomatic       Past Medical History:  Diagnosis Date  . AAA (abdominal aortic aneurysm) ([place])    per cardiology note , ultrasound retroperitoneal 07-22-2019,  5.5cm  . Arthritis   . Coronary artery disease cardiologist--- Bernerd Pho PA----  (06-08-2020  pt denies any cardiac s&s, sob, and no peripheral swelling  with exception chest pain with actos medication which chest pain stopped)   hx Inferolateral STEMI 05/ 21/ 2013  s/p PCI and DES to distal RCA and OM1 ,  and chronic occlusion mid RCA with left to right collecterals , preserved LVSF with inferior wall hypokinesis  . History of 2019 novel coronavirus disease (COVID-19) 04/2020   per pt positive covid at urgent care but unable to obatin result prior to surgery 06-11-2020;  pt stated had mild symptoms that resolved in a week  . History of ST elevation myocardial infarction (STEMI) 07/2011   inferolateral s/p PCI and stenting  . Hyperlipidemia   . Hypertension   . Prostate cancer [facility]) urologist--- dr Marilou Barnfield/  oncologist---- dr Tammi Klippel   dx 03-24-2020, Gleason 3+4, Stage T1c, PSA 5.7  . S/P drug eluting coronary stent placement 08/16/2011   PCI and DES x2  diatal RCA and OM1  . Type 2 diabetes mellitus ([place])    followed by pcp per pt ,  (06-08-2020 pt stated was taking actos 15mg  daily, but stopped taking about 4-6 wks ago due to causing leg and chest pain,  stated will tell his pcp at appointment 06-15-2020,  stated he watching is diet)    Past Surgical History:  Procedure Laterality Date  .  COLONOSCOPY WITH PROPOFOL N/A 01/18/2018   Procedure: COLONOSCOPY WITH PROPOFOL;  Surgeon: Daneil Dolin, MD;  [facility];  Service: Endoscopy;  Laterality: N/A;  10:15am  . CYSTOSCOPY  06/11/2020   Procedure: CYSTOSCOPY FLEXIBLE;  Surgeon: [name], MD;  Location: [facility];  Service: Urology;;  NO SEEDS FOUND IN BLADDER  . LEFT HEART CATHETERIZATION WITH CORONARY ANGIOGRAM N/A 08/16/2011   Procedure: LEFT HEART CATHETERIZATION WITH CORONARY ANGIOGRAM;  Surgeon: Peter M Martinique, MD;  Location: [facility] CATH LAB;  Service: Cardiovascular;  Laterality: N/A;  . POLYPECTOMY  01/18/2018   Procedure: POLYPECTOMY;  Surgeon: Daneil Dolin, MD;  [facility];  Service: Endoscopy;;  colon  . PROSTATE BIOPSY   03-24-2020  office  . RADIOACTIVE SEED IMPLANT N/A 06/11/2020   Procedure: RADIOACTIVE SEED IMPLANT/BRACHYTHERAPY IMPLANT;  Surgeon: [name], MD;  Location: [facility];  Service: Urology;  Laterality: N/A;   67    SEEDS IMPLANTED  . SPACE OAR INSTILLATION N/A 06/11/2020   Procedure: SPACE OAR INSTILLATION;  Surgeon: [name], MD;  Location: [facility];  Service: Urology;  Laterality: N/A;    Home Medications:  Allergies as of 09/01/2020   No Known Allergies     Medication List       Accurate as of September 01, 2020 12:45 PM. If you have any questions, ask your nurse or doctor.        aspirin EC 81 MG tablet Take 81 mg by mouth daily. Swallow whole.   Cholecalciferol 25 MCG (1000 UT) capsule Take 1,000 Units by mouth daily.   ciprofloxacin 250 MG tablet Commonly known as: Cipro Take 1 tablet (250 mg total) by mouth 2 (two) times daily.   metoprolol tartrate 50 MG tablet Commonly known as: LOPRESSOR Take 1 tablet (50 mg total) by mouth 2 (two) times daily. What changed: when to take this   pioglitazone 15 MG tablet Commonly known as: ACTOS Take 15 mg by mouth daily.   Repatha SureClick 097 MG/ML Soaj Generic drug: Evolocumab Inject 140 mg into the skin every 14 (fourteen) days.   sildenafil 100 MG tablet Commonly known as: VIAGRA Take 1 tablet (100 mg total) by mouth daily as needed for erectile dysfunction. Take 1/2-1 po prn   tamsulosin 0.4 MG Caps capsule Commonly known as: FLOMAX Take 1 capsule (0.4 mg total) by mouth daily after supper.   traMADol 50 MG tablet Commonly known as: Ultram Take 1 tablet (50 mg total) by mouth every 6 (six) hours as needed.   valsartan 160 MG tablet Commonly known as: DIOVAN Take 160 mg by mouth daily.       Allergies: No Known Allergies  Family History  Problem Relation Age of Onset  . Diabetes Brother   . Prostate cancer Brother   . Diabetes Sister   . Hypertension Brother    . Hypertension Sister   . Hyperlipidemia Sister   . Hyperlipidemia Brother   . Colon cancer Neg Hx   . Pancreatic cancer Neg Hx   . Breast cancer Neg Hx     Social History:  reports that he quit smoking about 3 years ago. His smoking use included cigarettes and cigars. He started smoking about 55 years ago. He quit after 15.00 years of use. He quit smokeless tobacco use about 9 months ago.  His smokeless tobacco use included chew. He reports current alcohol use. He reports that he does not use drugs.  ROS: A  complete review of systems was performed.  All systems are negative except for pertinent findings as noted.  Physical Exam:  Vital signs in last 24 hours: There were no vitals taken for this visit. Constitutional:  Alert and oriented, No acute distress Cardiovascular: Regular rate  Respiratory: Normal respiratory effort Neurologic: Grossly intact, no focal deficits Psychiatric: Normal mood and affect  I have reviewed prior pt notes  I have reviewed urinalysis results  I have independently reviewed prior imaging  I have reviewed prior PSA/pathology results   Impression/Assessment:  Low risk prostate cancer with high-volume, status post I-125 brachytherapy with SpaceOAR on 3.17.2022.  So far doing fairly well although he does have worsening of urinary symptomatology.  Plan:  1.  I will check his PSA today and before his visit in 3 months  2.  I did recommend that if his symptoms continue to get worse or bothersome, limit his caffeine and try taking tamsulosin twice a day

## 2020-09-01 NOTE — Progress Notes (Signed)
Urological Symptom Review  Patient is experiencing the following symptoms: Frequent urination Get up at night to urinate Leakage of urine Trouble starting stream Weak stream   Review of Systems  Gastrointestinal (upper)  : Negative for upper GI symptoms  Gastrointestinal (lower) : Diarrhea  Constitutional : Weight loss  Skin: Negative for skin symptoms  Eyes: Negative for eye symptoms  Ear/Nose/Throat : Negative for Ear/Nose/Throat symptoms  Hematologic/Lymphatic: Negative for Hematologic/Lymphatic symptoms  Cardiovascular : Negative for cardiovascular symptoms  Respiratory : Negative for respiratory symptoms  Endocrine: Negative for endocrine symptoms  Musculoskeletal: Negative for musculoskeletal symptoms  Neurological: Headaches  Psychologic: Negative for psychiatric symptoms

## 2020-09-02 LAB — PSA: Prostate Specific Ag, Serum: 2.5 ng/mL (ref 0.0–4.0)

## 2020-09-04 NOTE — Progress Notes (Signed)
Letter sent.

## 2020-09-10 ENCOUNTER — Ambulatory Visit
Admission: RE | Admit: 2020-09-10 | Discharge: 2020-09-10 | Disposition: A | Discharge status: Home / Self Care | Payer: Medicare HMO | Source: Ambulatory Visit | Attending: Radiation Oncology | Admitting: Radiation Oncology

## 2020-09-10 ENCOUNTER — Encounter: Payer: Self-pay | Admitting: Radiation Oncology

## 2020-09-10 DIAGNOSIS — C61 Malignant neoplasm of prostate: Secondary | ICD-10-CM | POA: Insufficient documentation

## 2020-09-10 DIAGNOSIS — Z510 Encounter for antineoplastic radiation therapy: Secondary | ICD-10-CM | POA: Diagnosis present

## 2020-09-23 NOTE — Progress Notes (Signed)
  Radiation Oncology         [phone] ________________________________  Name: Manuel Nguyen MRN: [id number]  Date: 09/10/2020  DOB: 10/03/1950  3D Planning Note   Prostate Brachytherapy Post-Implant Dosimetry  Diagnosis: 70 y.o. gentleman with Stage T1c adenocarcinoma of the prostate with Gleason score of 3+4, and PSA of 5.7.  Narrative: On a previous date, Manuel Nguyen returned following prostate seed implantation for post implant planning. He underwent CT scan complex simulation to delineate the three-dimensional structures of the pelvis and demonstrate the radiation distribution.  Since that time, the seed localization, and complex isodose planning with dose volume histograms have now been completed.  Results:   Prostate Coverage - The dose of radiation delivered to the 90% or more of the prostate gland (D90) was 109.29% of the prescription dose. This exceeds our goal of greater than 90%. Rectal Sparing - The volume of rectal tissue receiving the prescription dose or higher was 0.0 cc. This falls under our thresholds tolerance of 1.0 cc.  Impression: The prostate seed implant appears to show adequate target coverage and appropriate rectal sparing.  Plan:  The patient will continue to follow with urology for ongoing PSA determinations. I would anticipate a high likelihood for local tumor control with minimal risk for rectal morbidity.  ________________________________  Sheral Apley Tammi Klippel, M.D.

## 2020-11-02 IMAGING — US US EXTREM LOW VENOUS
1 series · 14 of 24 positions shown · non-contrast
Comparison: None.

CLINICAL DATA: Left lower extremity pain 3 days.

EXAM:
BILATERAL LOWER EXTREMITY VENOUS DOPPLER ULTRASOUND
TECHNIQUE: Gray-scale sonography with compression, as well as color and duplex
ultrasound, were performed to evaluate the deep venous system(s)
from the level of the common femoral vein through the popliteal and
proximal calf veins.

[Series 1: us venous img lower bilat (dvt) · portal-venous · 14 of 74 slices shown]
[im 1/74]
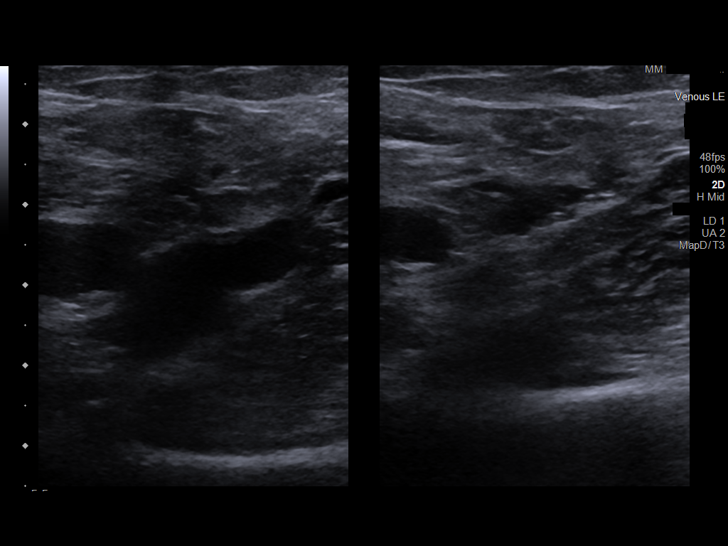
[im 7/74]
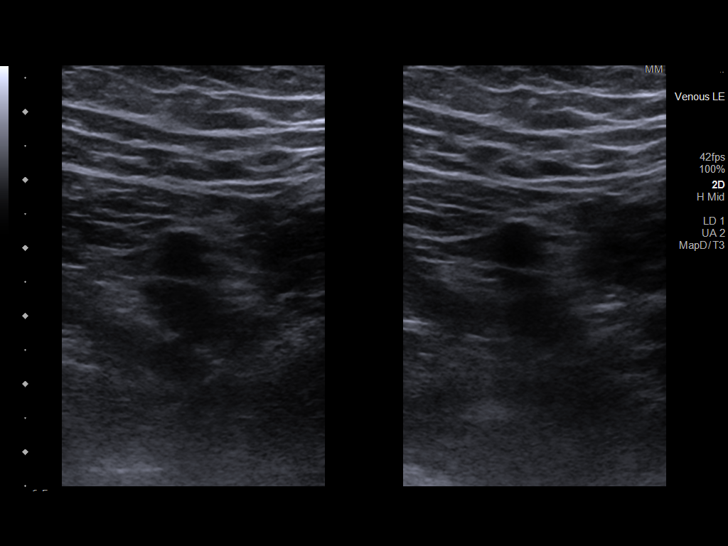
[im 13/74]
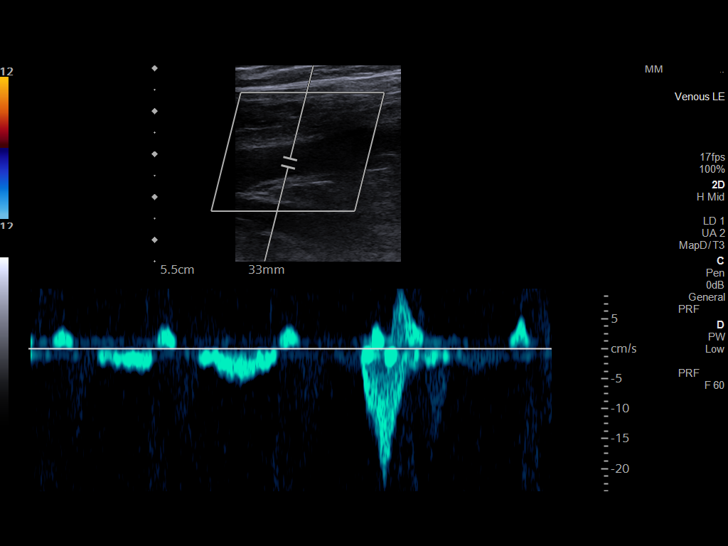
[im 20/74]
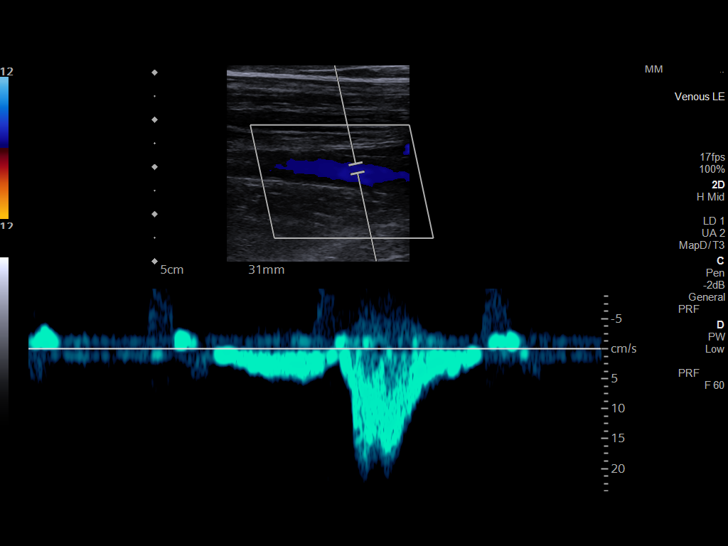
[im 23/74]
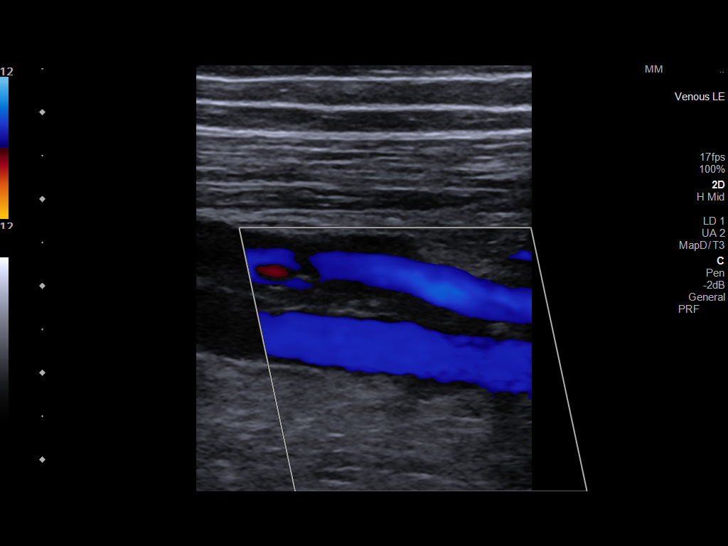
[im 29/74]
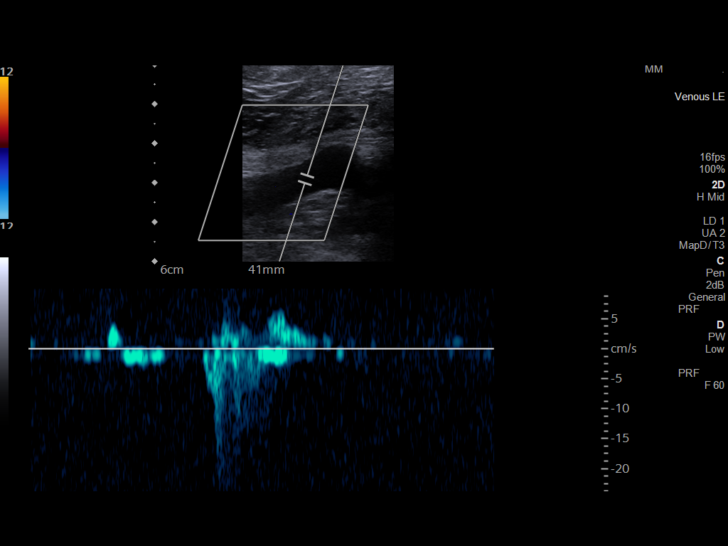
[im 35/74]
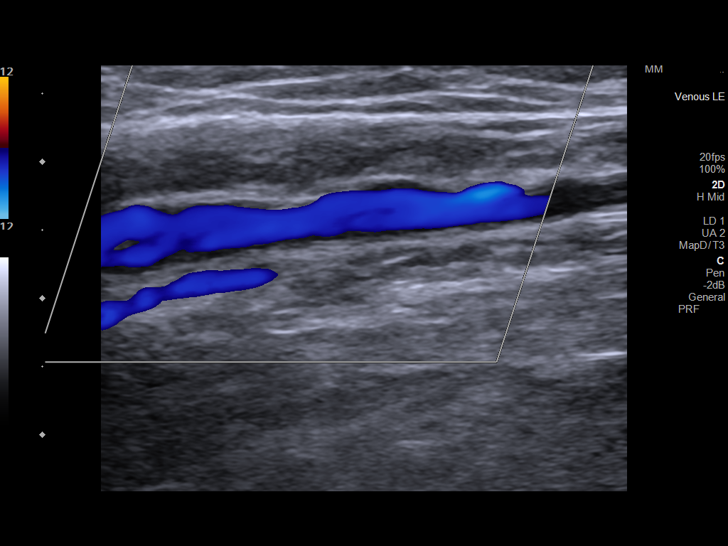
[im 39/74]
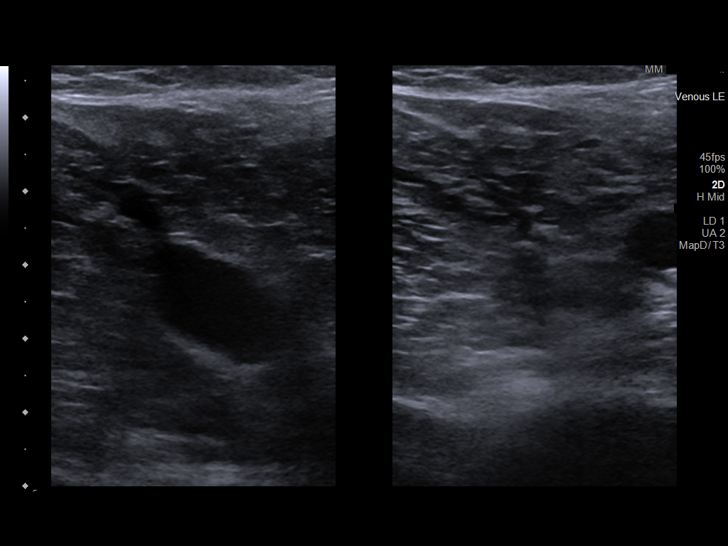
[im 45/74]
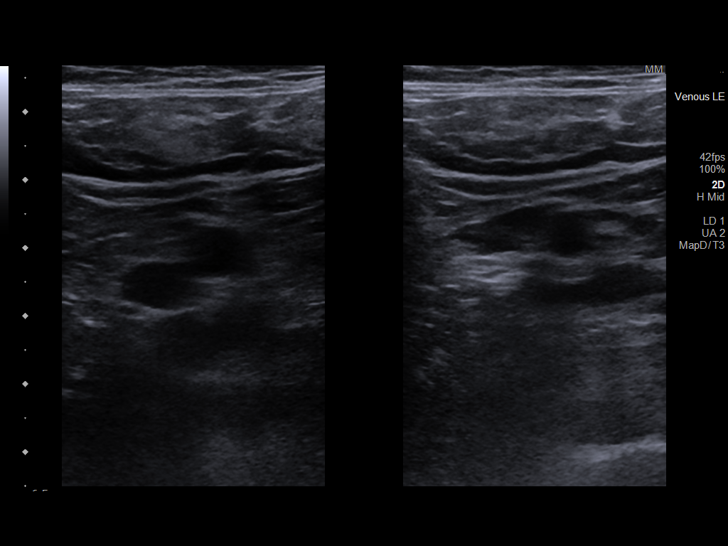
[im 51/74]
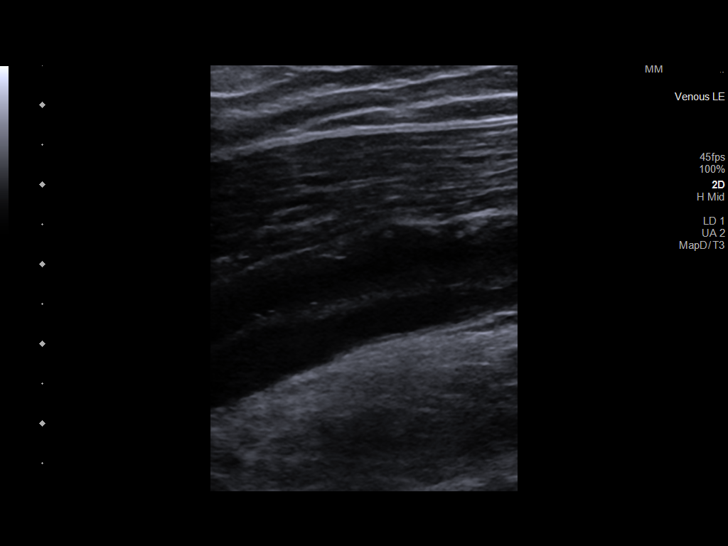
[im 58/74]
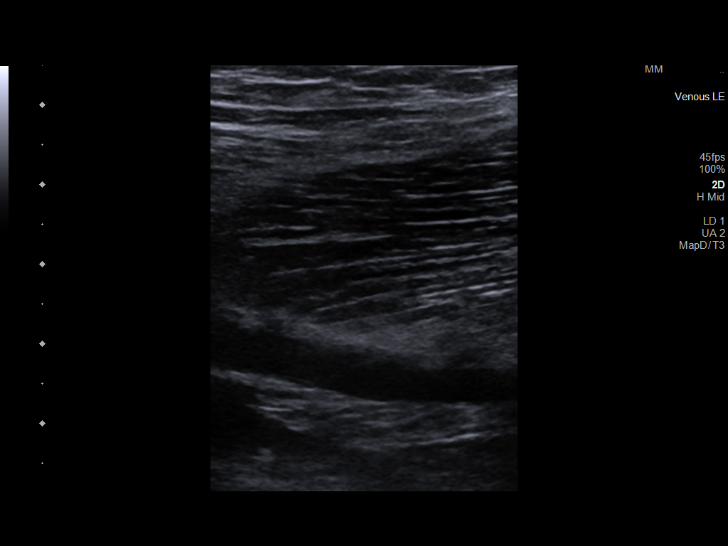
[im 61/74]
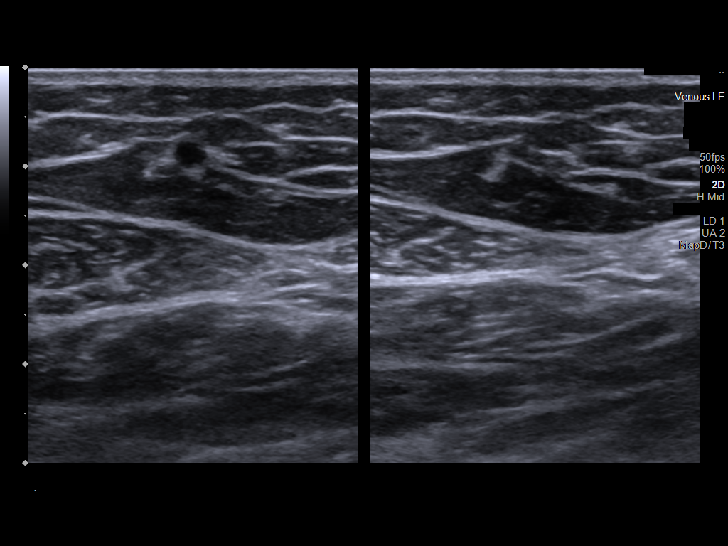
[im 67/74]
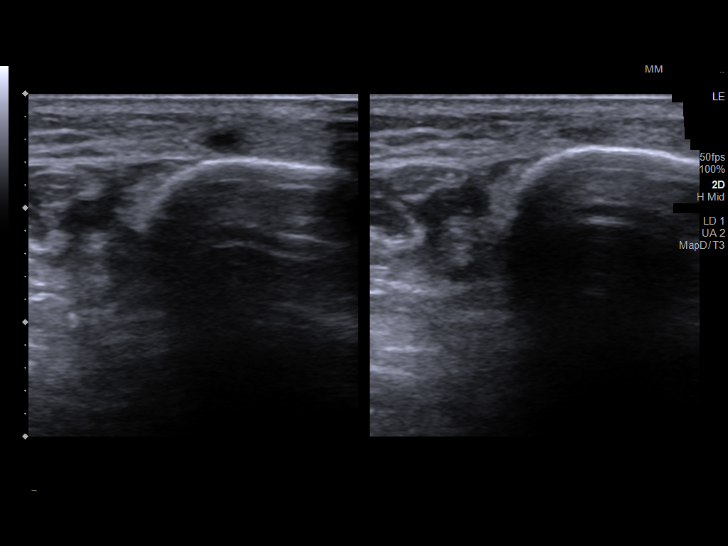
[im 74/74]
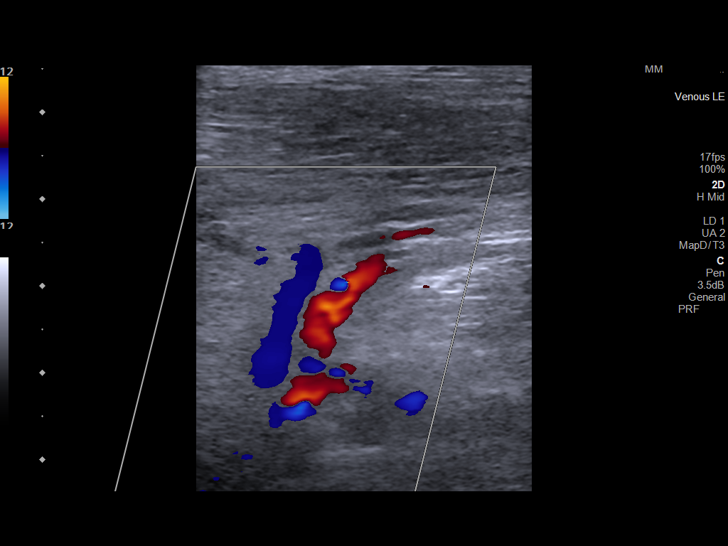

[14 of 24 positions shown; findings below may reference images not displayed]

FINDINGS: BILATERAL LOWER EXTREMITIES:

Normal compressibility of the common femoral, superficial femoral,
and popliteal veins, as well as the visualized calf veins.
Visualized portions of profunda femoral vein and great saphenous
vein unremarkable. No filling defects to suggest DVT on grayscale or
color Doppler imaging. Doppler waveforms show normal direction of
venous flow, normal respiratory plasticity and response to
augmentation.

OTHER

None.

Limitations: none
IMPRESSION: No evidence of venous thrombus in bilateral lower extremities.

## 2020-12-02 ENCOUNTER — Encounter: Payer: Self-pay | Admitting: *Deleted

## 2020-12-08 ENCOUNTER — Ambulatory Visit: Payer: Medicare HMO | Admitting: Urology

## 2020-12-08 ENCOUNTER — Other Ambulatory Visit: Payer: Self-pay

## 2020-12-08 ENCOUNTER — Encounter: Payer: Self-pay | Admitting: Urology

## 2020-12-08 VITALS — BP 128/67 | HR 89

## 2020-12-08 DIAGNOSIS — Z8546 Personal history of malignant neoplasm of prostate: Secondary | ICD-10-CM

## 2020-12-08 DIAGNOSIS — N5201 Erectile dysfunction due to arterial insufficiency: Secondary | ICD-10-CM | POA: Diagnosis not present

## 2020-12-08 LAB — URINALYSIS, ROUTINE W REFLEX MICROSCOPIC
Bilirubin, UA: NEGATIVE
Glucose, UA: NEGATIVE
Ketones, UA: NEGATIVE
Leukocytes,UA: NEGATIVE
Nitrite, UA: NEGATIVE
Protein,UA: NEGATIVE
RBC, UA: NEGATIVE
Specific Gravity, UA: 1.02 (ref 1.005–1.030)
Urobilinogen, Ur: 0.2 mg/dL (ref 0.2–1.0)
pH, UA: 5.5 (ref 5.0–7.5)

## 2020-12-08 NOTE — Progress Notes (Signed)
Urological Symptom Review  Patient is experiencing the following symptoms: Frequent urination Get up at night to urinate Leakage of urine   Review of Systems  Gastrointestinal (upper)  : Negative for upper GI symptoms  Gastrointestinal (lower) : Negative for lower GI symptoms  Constitutional : Negative for symptoms  Skin: Negative for skin symptoms  Eyes: Negative for eye symptoms  Ear/Nose/Throat : Negative for Ear/Nose/Throat symptoms  Hematologic/Lymphatic: Negative for Hematologic/Lymphatic symptoms  Cardiovascular : Negative for cardiovascular symptoms  Respiratory : Negative for respiratory symptoms  Endocrine: Negative for endocrine symptoms  Musculoskeletal: Negative for musculoskeletal symptoms  Neurological: Negative for neurological symptoms  Psychologic: Negative for psychiatric symptoms

## 2020-12-08 NOTE — Progress Notes (Signed)
History of Present Illness: Here for follow-up of prostate cancer, treated  12.7.2021: TRUS/Bx. PSA 5.7, prostate volume 49.76 mL. PSAD 0.11. 8/12 cores positive for adenocarcinoma. 6 cores w/ GS 3+3 pattern-- Lt mid lateral 5% of core, Lt apex lateral 30% of core,  Rt apex medial, 5% of core,  Rt apex lateral, 20% of core, Rt mid lateral, 5% of core, Rt base lateral, 10% of core 2 cores revealed GS 3+4 pattern --Rt mid medial, 10% of core,  Lt base lateral, 10% of core   3.17.2022: Underwent I 1225 brachytherapy and SpaceOAR placement.   9.13.2022: At June visit, PSA was 2.5.  He has not had recent PSA.  He does not have significant lower urinary tract symptoms.  He is on tamsulosin. IPSS 11, quality-of-life score 2.  He denies any blood in his urine or stool.  IPSS Questionnaire (AUA-7): Over the past month.   1)  How often have you had a sensation of not emptying your bladder completely after you finish urinating?  3 - About half the time  2)  How often have you had to urinate again less than two hours after you finished urinating? 3 - About half the time  3)  How often have you found you stopped and started again several times when you urinated?  1 - Less than 1 time in 5  4) How difficult have you found it to postpone urination?  0 - Not at all  5) How often have you had a weak urinary stream?  1 - Less than 1 time in 5  6) How often have you had to push or strain to begin urination?  1 - Less than 1 time in 5  7) How many times did you most typically get up to urinate from the time you went to bed until the time you got up in the morning?  2 - 2 times  Total score:  0-7 mildly symptomatic   8-19 moderately symptomatic   20-35 severely symptomatic    Past Medical History:  Diagnosis Date   AAA (abdominal aortic aneurysm) ([place])    per cardiology note , ultrasound retroperitoneal 07-22-2019,  5.5cm   Arthritis    Coronary artery disease cardiologist--- Bernerd Pho PA----   (06-08-2020  pt denies any cardiac s&s, sob, and no peripheral swelling with exception chest pain with actos medication which chest pain stopped)   hx Inferolateral STEMI 05/ 21/ 2013  s/p PCI and DES to distal RCA and OM1 ,  and chronic occlusion mid RCA with left to right collecterals , preserved LVSF with inferior wall hypokinesis   History of 2019 novel coronavirus disease (COVID-19) 04/2020   per pt positive covid at urgent care but unable to obatin result prior to surgery 06-11-2020;  pt stated had mild symptoms that resolved in a week   History of ST elevation myocardial infarction (STEMI) 07/2011   inferolateral s/p PCI and stenting   Hyperlipidemia    Hypertension    Prostate cancer [facility]) urologist--- dr Yarieliz Wasser/  oncologist---- dr Tammi Klippel   dx 03-24-2020, Gleason 3+4, Stage T1c, PSA 5.7   S/P drug eluting coronary stent placement 08/16/2011   PCI and DES x2  diatal RCA and OM1   Type 2 diabetes mellitus ([place])    followed by pcp per pt ,  (06-08-2020 pt stated was taking actos '15mg'$  daily, but stopped taking about 4-6 wks ago due to causing leg and chest pain,  stated will tell his pcp at  appointment 06-15-2020,  stated he watching is diet)    Past Surgical History:  Procedure Laterality Date   COLONOSCOPY WITH PROPOFOL N/A 01/18/2018   Procedure: COLONOSCOPY WITH PROPOFOL;  Surgeon: Daneil Dolin, MD;  [facility];  Service: Endoscopy;  Laterality: N/A;  10:15am   CYSTOSCOPY  06/11/2020   Procedure: CYSTOSCOPY FLEXIBLE;  Surgeon: [name], MD;  Location: [facility];  Service: Urology;;  NO SEEDS FOUND IN BLADDER   LEFT HEART CATHETERIZATION WITH CORONARY ANGIOGRAM N/A 08/16/2011   Procedure: LEFT HEART CATHETERIZATION WITH CORONARY ANGIOGRAM;  Surgeon: Peter M Martinique, MD;  Location: [facility] CATH LAB;  Service: Cardiovascular;  Laterality: N/A;   POLYPECTOMY  01/18/2018   Procedure: POLYPECTOMY;  Surgeon: Daneil Dolin, MD;  [facility];  Service: Endoscopy;;  colon   PROSTATE BIOPSY  03-24-2020  office   RADIOACTIVE SEED IMPLANT N/A 06/11/2020   Procedure: RADIOACTIVE SEED IMPLANT/BRACHYTHERAPY IMPLANT;  Surgeon: [name], MD;  Location: [facility];  Service: Urology;  Laterality: N/A;   61    SEEDS IMPLANTED   SPACE OAR INSTILLATION N/A 06/11/2020   Procedure: SPACE OAR INSTILLATION;  Surgeon: [name], MD;  Location: [facility];  Service: Urology;  Laterality: N/A;    Home Medications:  Allergies as of 12/08/2020   No Known Allergies      Medication List        Accurate as of December 08, 2020  1:36 PM. If you have any questions, ask your nurse or doctor.          metoprolol tartrate 50 MG tablet Commonly known as: LOPRESSOR Take 1 tablet (50 mg total) by mouth 2 (two) times daily. What changed: when to take this   Repatha SureClick XX123456 MG/ML Soaj Generic drug: Evolocumab Inject 140 mg into the skin every 14 (fourteen) days.   valsartan 160 MG tablet Commonly known as: DIOVAN Take 160 mg by mouth daily.        Allergies: No Known Allergies  Family History  Problem Relation Age of Onset   Diabetes Brother    Prostate cancer Brother    Diabetes Sister    Hypertension Brother    Hypertension Sister    Hyperlipidemia Sister    Hyperlipidemia Brother    Colon cancer Neg Hx    Pancreatic cancer Neg Hx    Breast cancer Neg Hx     Social History:  reports that he quit smoking about 3 years ago. His smoking use included cigarettes and cigars. He started smoking about 56 years ago. He quit smokeless tobacco use about 12 months ago.  His smokeless tobacco use included chew. He reports current alcohol use. He reports that he does not use drugs.  ROS: A complete review of systems was performed.  All systems are negative except for pertinent findings as noted.  Physical Exam:  Vital signs in last 24 hours: There were no vitals taken for this  visit. Constitutional:  Alert and oriented, No acute distress Cardiovascular: Regular rate  Respiratory: Normal respiratory effort GI: Abdomen is soft, nontender, nondistended, no abdominal masses. No CVAT.  Genitourinary: Normal anal sphincter tone.  Gland 30 g, symmetric, nonnodular, nontender. Lymphatic: No lymphadenopathy Neurologic: Grossly intact, no focal deficits Psychiatric: Normal mood and affect  I have reviewed prior pt notes  I have reviewed notes from referring/previous physicians  I have reviewed urinalysis results  I have reviewed prior PSA/pathology results    Impression/Assessment:  1.  History of prostate cancer, high-volume favorable intermediate risk, status post brachytherapy in March of this year.  He seems to be doing well with no significant radiation related sequelae.  2.  ED  Plan:  1.  I will check his PSA today  2.  I will have him come back in 4 months for his next visit, we will try to get a PSA before then.

## 2020-12-09 LAB — PSA: Prostate Specific Ag, Serum: 2.2 ng/mL (ref 0.0–4.0)

## 2020-12-11 NOTE — Progress Notes (Signed)
Results printed and mailed.   

## 2020-12-30 ENCOUNTER — Other Ambulatory Visit: Payer: Self-pay

## 2020-12-30 ENCOUNTER — Ambulatory Visit: Payer: Medicare HMO | Admitting: Cardiology

## 2020-12-30 ENCOUNTER — Other Ambulatory Visit (HOSPITAL_COMMUNITY)
Admission: RE | Admit: 2020-12-30 | Discharge: 2020-12-30 | Disposition: A | Discharge status: Home / Self Care | Payer: Medicare HMO | Source: Ambulatory Visit | Attending: Cardiology | Admitting: Cardiology

## 2020-12-30 ENCOUNTER — Encounter: Payer: Self-pay | Admitting: Cardiology

## 2020-12-30 VITALS — BP 144/92 | HR 77 | Ht 69.0 in | Wt 216.4 lb

## 2020-12-30 DIAGNOSIS — E7800 Pure hypercholesterolemia, unspecified: Secondary | ICD-10-CM

## 2020-12-30 DIAGNOSIS — I714 Abdominal aortic aneurysm, without rupture, unspecified: Secondary | ICD-10-CM | POA: Diagnosis present

## 2020-12-30 DIAGNOSIS — I10 Essential (primary) hypertension: Secondary | ICD-10-CM

## 2020-12-30 DIAGNOSIS — I2510 Atherosclerotic heart disease of native coronary artery without angina pectoris: Secondary | ICD-10-CM

## 2020-12-30 LAB — BASIC METABOLIC PANEL
Anion gap: 7 (ref 5–15)
BUN: 20 mg/dL (ref 8–23)
CO2: 24 mmol/L (ref 22–32)
Calcium: 9.2 mg/dL (ref 8.9–10.3)
Chloride: 106 mmol/L (ref 98–111)
Creatinine, Ser: 1.8 mg/dL — ABNORMAL HIGH (ref 0.61–1.24)
GFR, Estimated: 40 mL/min — ABNORMAL LOW (ref >60)
Glucose, Bld: 116 mg/dL — ABNORMAL HIGH (ref 70–99)
Potassium: 4.3 mmol/L (ref 3.5–5.1)
Sodium: 137 mmol/L (ref 135–145)

## 2020-12-30 MED ORDER — VALSARTAN 160 MG PO TABS: 160.0000 mg | ORAL_TABLET | Freq: Every day | ORAL | 3 refills | Status: DC

## 2020-12-30 MED ORDER — METOPROLOL TARTRATE 50 MG PO TABS: 50.0000 mg | ORAL_TABLET | Freq: Every day | ORAL | 3 refills | Status: DC

## 2020-12-30 MED ORDER — REPATHA SURECLICK 140 MG/ML ~~LOC~~ SOAJ: 140.0000 mg | SUBCUTANEOUS | 3 refills | Status: DC

## 2020-12-30 NOTE — Addendum Note (Signed)
Addended by: Antonieta Iba on: 12/30/2020 08:53 AM   Modules accepted: Orders

## 2020-12-30 NOTE — Patient Instructions (Signed)
Medication Instructions:  Your physician recommends that you continue on your current medications as directed. Please refer to the Current Medication list given to you today.  *If you need a refill on your cardiac medications before your next appointment, please call your pharmacy*   Lab Work: TODAY: CMET  If you have labs (blood work) drawn today and your tests are completely normal, you will receive your results only by: [place] (if you have MyChart) OR A paper copy in the mail If you have any lab test that is abnormal or we need to change your treatment, we will call you to review the results.   Testing/Procedures: Your physician has requested that you have an abdominal aorta duplex. During this test, an ultrasound is used to evaluate the aorta. Allow 30 minutes for this exam. Do not eat after midnight the day before and avoid carbonated beverages  Follow-Up: At [facility], you and your health needs are our priority.  As part of our continuing mission to provide you with exceptional heart care, we have created designated Provider Care Teams.  These Care Teams include your primary Cardiologist (physician) and Advanced Practice Providers (APPs -  Physician Assistants and Nurse Practitioners) who all work together to provide you with the care you need, when you need it.  Your next appointment:   1 year(s)  The format for your next appointment:   In Person  Provider:   You may see Fransico Him, MD or one of the following Advanced Practice Providers on your designated Care Team:   [place], PA-C  Ermalinda Barrios, [place]

## 2020-12-30 NOTE — Progress Notes (Signed)
Cardiology Office Note    Date:  12/30/2020   ID:  Manuel, Nguyen 06-28-1950, MRN [id number]  PCP:  [name], NP  Cardiologist: Kate Sable, MD (Inactive) --> Will need to switch to new MD at next visit  Chief Complaint  Patient presents with   Coronary Artery Disease   Hypertension   Hyperlipidemia     History of Present Illness:    Manuel Nguyen is a 70 y.o. male with past medical history of CAD (s/p STEMI in 07/2011 with DES to OM1 and noted to have CTO of RCA), HTN and HLD (statin intolerant).   He is here today for followup and is doing well.  He denies any anginal chest pain or pressure, SOB, DOE, PND, orthopnea, LE edema, dizziness, palpitations or syncope. He is compliant with his meds and is tolerating meds with no SE.     Past Medical History:  Diagnosis Date   AAA (abdominal aortic aneurysm)    per cardiology note , ultrasound retroperitoneal 07-22-2019,  5.5cm   Arthritis    Coronary artery disease cardiologist--- Bernerd Pho PA----  (06-08-2020  pt denies any cardiac s&s, sob, and no peripheral swelling with exception chest pain with actos medication which chest pain stopped)   hx Inferolateral STEMI 05/ 21/ 2013  s/p PCI and DES to distal RCA and OM1 ,  and chronic occlusion mid RCA with left to right collecterals , preserved LVSF with inferior wall hypokinesis   History of 2019 novel coronavirus disease (COVID-19) 04/2020   per pt positive covid at urgent care but unable to obatin result prior to surgery 06-11-2020;  pt stated had mild symptoms that resolved in a week   History of ST elevation myocardial infarction (STEMI) 07/2011   inferolateral s/p PCI and stenting   Hyperlipidemia    Hypertension    Prostate cancer [facility]) urologist--- dr dahlstedt/  oncologist---- dr Tammi Klippel   dx 03-24-2020, Gleason 3+4, Stage T1c, PSA 5.7   S/P drug eluting coronary stent placement 08/16/2011   PCI and DES x2  diatal RCA and OM1   Type 2 diabetes  mellitus ([place])    followed by pcp per pt ,  (06-08-2020 pt stated was taking actos 15mg  daily, but stopped taking about 4-6 wks ago due to causing leg and chest pain,  stated will tell his pcp at appointment 06-15-2020,  stated he watching is diet)    Past Surgical History:  Procedure Laterality Date   COLONOSCOPY WITH PROPOFOL N/A 01/18/2018   Procedure: COLONOSCOPY WITH PROPOFOL;  Surgeon: Daneil Dolin, MD;  [facility];  Service: Endoscopy;  Laterality: N/A;  10:15am   CYSTOSCOPY  06/11/2020   Procedure: CYSTOSCOPY FLEXIBLE;  Surgeon: [name], MD;  Location: [facility];  Service: Urology;;  NO SEEDS FOUND IN BLADDER   LEFT HEART CATHETERIZATION WITH CORONARY ANGIOGRAM N/A 08/16/2011   Procedure: LEFT HEART CATHETERIZATION WITH CORONARY ANGIOGRAM;  Surgeon: Peter M Martinique, MD;  Location: [facility] CATH LAB;  Service: Cardiovascular;  Laterality: N/A;   POLYPECTOMY  01/18/2018   Procedure: POLYPECTOMY;  Surgeon: Daneil Dolin, MD;  [facility];  Service: Endoscopy;;  colon   PROSTATE BIOPSY  03-24-2020  office   RADIOACTIVE SEED IMPLANT N/A 06/11/2020   Procedure: RADIOACTIVE SEED IMPLANT/BRACHYTHERAPY IMPLANT;  Surgeon: [name], MD;  Location: [facility];  Service: Urology;  Laterality: N/A;   67    SEEDS IMPLANTED   SPACE OAR INSTILLATION  N/A 06/11/2020   Procedure: SPACE OAR INSTILLATION;  Surgeon: [name], MD;  Location: [facility];  Service: Urology;  Laterality: N/A;    Current Medications: Outpatient Medications Prior to Visit  Medication Sig Dispense Refill   Evolocumab (REPATHA SURECLICK) 315 MG/ML SOAJ Inject 140 mg into the skin every 14 (fourteen) days. 2 pen 11   metoprolol (LOPRESSOR) 50 MG tablet Take 1 tablet (50 mg total) by mouth 2 (two) times daily. (Patient taking differently: Take 50 mg by mouth daily.) 60 tablet 6   tamsulosin (FLOMAX) 0.4 MG CAPS capsule Take 0.4 mg by  mouth.     valsartan (DIOVAN) 160 MG tablet Take 160 mg by mouth daily.     No facility-administered medications prior to visit.     Allergies:   Patient has no known allergies.   Social History   Socioeconomic History   Marital status: Divorced    Spouse name: Not on file   Number of children: Not on file   Years of education: Not on file   Highest education level: Not on file  Occupational History   Not on file  Tobacco Use   Smoking status: Former    Years: 15.00    Types: Cigarettes, Cigars    Start date: 12/21/1964    Quit date: 08/15/2017    Years since quitting: 3.3   Smokeless tobacco: Former    Types: Chew    Quit date: 11/12/2019  Vaping Use   Vaping Use: Never used  Substance and Sexual Activity   Alcohol use: Yes    Alcohol/week: 0.0 standard drinks    Comment: occasional   Drug use: No   Sexual activity: Yes    Partners: Female  Other Topics Concern   Not on file  Social History Narrative   Not on file   Social Determinants of Health   Financial Resource Strain: Not on file  Food Insecurity: Not on file  Transportation Needs: Not on file  Physical Activity: Not on file  Stress: Not on file  Social Connections: Not on file     Family History:  The patient's family history includes Diabetes in his brother and sister; Hyperlipidemia in his brother and sister; Hypertension in his brother and sister.   Review of Systems:   Please see the history of present illness.     General:  No chills, fever, night sweats or weight changes.  Cardiovascular:  No chest pain, dyspnea on exertion, edema, orthopnea, palpitations, paroxysmal nocturnal dyspnea. Dermatological: No rash, lesions/masses Respiratory: No cough, dyspnea MSK: Positive for leg pain (improved).  Urologic: No hematuria, dysuria Abdominal:   No nausea, vomiting, diarrhea, bright red blood per rectum, melena, or hematemesis Neurologic:  No visual changes, wkns, changes in mental status. All  other systems reviewed and are otherwise negative except as noted above.   Physical Exam:    VS:  BP (!) 144/92   Pulse 77   Ht 5\' 9"  (1.753 m)   Wt 216 lb 6.4 oz (98.2 kg)   SpO2 98%   BMI 31.96 kg/m    GEN: Well nourished, well developed in no acute distress HEENT: Normal NECK: No JVD; No carotid bruits LYMPHATICS: No lymphadenopathy CARDIAC:RRR, no murmurs, rubs, gallops RESPIRATORY:  Clear to auscultation without rales, wheezing or rhonchi  ABDOMEN: Soft, non-tender, non-distended MUSCULOSKELETAL:  No edema; No deformity  SKIN: Warm and dry NEUROLOGIC:  Alert and oriented x 3 PSYCHIATRIC:  Normal affect    Wt Readings from Last  3 Encounters:  12/30/20 216 lb 6.4 oz (98.2 kg)  07/22/20 214 lb (97.1 kg)  06/11/20 213 lb 12.8 oz (97 kg)     Studies/Labs Reviewed:   EKG:  EKG is not ordered today.   Recent Labs: 06/08/2020: ALT 19; BUN 18; Creatinine, Ser 1.66; Hemoglobin 13.8; Platelets 215; Potassium 3.7; Sodium 138   Lipid Panel    Component Value Date/Time   CHOL 300 (H) 08/17/2011 0954   TRIG 171 (H) 08/17/2011 0954   HDL 40 08/17/2011 0954   CHOLHDL 7.5 08/17/2011 0954   VLDL 34 08/17/2011 0954   LDLCALC 226 (H) 08/17/2011 0954    Additional studies/ records that were reviewed today include:   Echocardiogram: 07/2011 Study Conclusions   - Left ventricle: Systolic function was normal. The    estimated ejection fraction was in the range of 55% to    60%. Basal posterior and basal anterolateral hypokinesis.    Features are consistent with a pseudonormal left    ventricular filling pattern, with concomitant abnormal    relaxation and increased filling pressure (grade 2    diastolic dysfunction).  - Aortic valve: There was no stenosis.  - Mitral valve: Mildly calcified annulus. Trivial    regurgitation.  - Left atrium: The atrium was mildly dilated.  - Right ventricle: The cavity size was normal. Systolic    function was normal.  - Pulmonary arteries:  No complete TR doppler jet so unable    to estimate PA systolic pressure.  - Systemic veins: IVC measured 2.0 cm with normal    respirophasic variation, suggesting RA pressure 6-10 mmHg.  Impressions:   - Normal LV size and systolic function, EF 47-42%. Basal    posterior and basal anterolateral hypokinesis. Moderate    diastolic dysfunction. Normal RV size and systolic    function. No significant valvular dysfunction.    Assessment:    1. Coronary artery disease involving native coronary artery of native heart without angina pectoris   2. Primary hypertension   3. Pure hypercholesterolemia   4. Abdominal aortic aneurysm (AAA) without rupture, unspecified part       Plan:   In order of problems listed above:  1. CAD -s/p STEMI in 07/2011 with DES to OM1 and noted to have CTO of RCA.  -he has not had any further anginal sx since we saw him last -continue prescription drug management with Lopressor 50mg  BID, ASA 81mg  daily and Repatha>refilled  2. HTN - BP is borderline  controlled on exam today at 144/4mmHg with goal < 130/77mmHg>>he did not take his meds yesterday -I stressed the importance of taking his BP meds daily given his AAA -continue prescription drug management with Lopressor 50mg  BID and Valsartan 160mg  daily>refilled -check CMET today  3. HLD -LDL goal < 70 -He has been intolerant to multiple statins in the past and remains on Repatha.  -I have personally reviewed and interpreted outside labs performed by patient's PCP which showed LDL 87, HDL 43 and TAG 125 in Aug 2022   4. AAA - Retroperitoneal ultrasound in 06/2019 at [facility] showed aneurysmal dilation of the distal aorta measuring 5.5 cm by review of the report.  -I will order a dedicated AAA [place] to evaluate further>this was supposed to be done last year but was never completed   Followup in 1 year in our office  Signed, Fransico Him, MD  12/30/2020 8:37 AM    [address] [place], [place]  [place] Phone: [phone] Fax: [phone]

## 2021-01-04 ENCOUNTER — Ambulatory Visit (HOSPITAL_COMMUNITY)
Admission: RE | Admit: 2021-01-04 | Discharge: 2021-01-04 | Disposition: A | Discharge status: Home / Self Care | Payer: Medicare HMO | Source: Ambulatory Visit | Attending: Cardiology | Admitting: Cardiology

## 2021-01-04 ENCOUNTER — Other Ambulatory Visit: Payer: Self-pay | Admitting: Cardiology

## 2021-01-04 ENCOUNTER — Other Ambulatory Visit: Payer: Self-pay

## 2021-01-04 DIAGNOSIS — E7800 Pure hypercholesterolemia, unspecified: Secondary | ICD-10-CM

## 2021-01-04 DIAGNOSIS — I10 Essential (primary) hypertension: Secondary | ICD-10-CM | POA: Insufficient documentation

## 2021-01-04 DIAGNOSIS — I2510 Atherosclerotic heart disease of native coronary artery without angina pectoris: Secondary | ICD-10-CM | POA: Diagnosis not present

## 2021-01-04 DIAGNOSIS — I714 Abdominal aortic aneurysm, without rupture, unspecified: Secondary | ICD-10-CM | POA: Diagnosis present

## 2021-01-05 ENCOUNTER — Telehealth: Payer: Self-pay | Admitting: *Deleted

## 2021-01-05 DIAGNOSIS — I714 Abdominal aortic aneurysm, without rupture, unspecified: Secondary | ICD-10-CM

## 2021-01-05 NOTE — Telephone Encounter (Signed)
Pt notified and agrees with plan of care to be seen by Vascular Surgery.

## 2021-01-17 NOTE — Progress Notes (Signed)
[facility] [place]  ASSESSMENT / PLAN: Manuel Nguyen is a 70 y.o. male with a infrarenal abdominal aortic aneurysm measuring 8mm.  A statement from the [place] for Vascular Surgery and Society for Vascular Surgery estimated the annual rupture risk according to AAA diameter to be the following: 4.0 cm to 4.9 cm in diameter - 0.5% to 5%  The patient is not yet a candidate for elective repair of the aneurysm to prevent rupture.  Recommend the following to reduce the risk of major adverse cardiac / limb events.  Complete cessation from all tobacco products. Blood glucose control with goal A1c < 7%. Blood pressure control with goal blood pressure < 140/90 mmHg. Lipid reduction therapy with goal LDL-C <100 mg/dL. Aspirin 81mg  PO QD.  Atorvastatin 40-80mg  PO QD (or other "high intensity" statin therapy).  Follow up in 1 year with VVS PA with repeat AAA duplex.  CHIEF COMPLAINT: AAA demonstrated on duplex.   HISTORY OF PRESENT ILLNESS: Manuel Nguyen is a 70 y.o. male referred to clinic for discovery of abdominal aortic aneurysm on ultrasound by Dr. Radford Pax.  He is referred for discussion of aneurysm.  The patient is asymptomatic.  He has multiple excellent questions about aneurysm disease.  We had a lengthy discussion about the natural history of aneurysm disease, the rationale for surveillance program, and the threshold for intervention (5.5 cm in men).  VASCULAR SURGICAL HISTORY: none  VASCULAR RISK FACTORS: Negative history of stroke / transient ischemic attack. Positive history of coronary artery disease. + history of PCI (2013). Negative history of CABG.  Positive history of diabetes mellitus. Last A1c 7.2. Positive history of smoking. Occasional cigar.  Positive history of hypertension.  Positive history of chronic kidney disease.  Last GFR 40. CKD 3. Negative history of chronic obstructive pulmonary disease.  FUNCTIONAL  STATUS: ECOG performance status: (0) Fully active, able to carry on all predisease performance without restriction Ambulatory status: Ambulatory within the community without limits  Past Medical History:  Diagnosis Date   AAA (abdominal aortic aneurysm)    per cardiology note , ultrasound retroperitoneal 07-22-2019,  5.5cm   Arthritis    Coronary artery disease cardiologist--- Bernerd Pho PA----  (06-08-2020  pt denies any cardiac s&s, sob, and no peripheral swelling with exception chest pain with actos medication which chest pain stopped)   hx Inferolateral STEMI 05/ 21/ 2013  s/p PCI and DES to distal RCA and OM1 ,  and chronic occlusion mid RCA with left to right collecterals , preserved LVSF with inferior wall hypokinesis   History of 2019 novel coronavirus disease (COVID-19) 04/2020   per pt positive covid at urgent care but unable to obatin result prior to surgery 06-11-2020;  pt stated had mild symptoms that resolved in a week   History of ST elevation myocardial infarction (STEMI) 07/2011   inferolateral s/p PCI and stenting   Hyperlipidemia    Hypertension    Prostate cancer [facility]) urologist--- dr dahlstedt/  oncologist---- dr Tammi Klippel   dx 03-24-2020, Gleason 3+4, Stage T1c, PSA 5.7   S/P drug eluting coronary stent placement 08/16/2011   PCI and DES x2  diatal RCA and OM1   Type 2 diabetes mellitus ([place])    followed by pcp per pt ,  (06-08-2020 pt stated was taking actos 15mg  daily, but stopped taking about 4-6 wks ago due to causing leg and chest pain,  stated will tell his pcp at appointment 06-15-2020,  stated  he watching is diet)    Past Surgical History:  Procedure Laterality Date   COLONOSCOPY WITH PROPOFOL N/A 01/18/2018   Procedure: COLONOSCOPY WITH PROPOFOL;  Surgeon: Daneil Dolin, MD;  [facility];  Service: Endoscopy;  Laterality: N/A;  10:15am   CYSTOSCOPY  06/11/2020   Procedure: CYSTOSCOPY FLEXIBLE;  Surgeon: [name], MD;  [facility];  Service: Urology;;  NO SEEDS FOUND IN BLADDER   LEFT HEART CATHETERIZATION WITH CORONARY ANGIOGRAM N/A 08/16/2011   Procedure: LEFT HEART CATHETERIZATION WITH CORONARY ANGIOGRAM;  Surgeon: Peter M Martinique, MD;  Location: [facility] CATH LAB;  Service: Cardiovascular;  Laterality: N/A;   POLYPECTOMY  01/18/2018   Procedure: POLYPECTOMY;  Surgeon: Daneil Dolin, MD;  [facility];  Service: Endoscopy;;  colon   PROSTATE BIOPSY  03-24-2020  office   RADIOACTIVE SEED IMPLANT N/A 06/11/2020   Procedure: RADIOACTIVE SEED IMPLANT/BRACHYTHERAPY IMPLANT;  Surgeon: [name], MD;  Location: [facility];  Service: Urology;  Laterality: N/A;   1    SEEDS IMPLANTED   SPACE OAR INSTILLATION N/A 06/11/2020   Procedure: SPACE OAR INSTILLATION;  Surgeon: [name], MD;  Location: [facility];  Service: Urology;  Laterality: N/A;    Family History  Problem Relation Age of Onset   Diabetes Brother    Prostate cancer Brother    Diabetes Sister    Hypertension Brother    Hypertension Sister    Hyperlipidemia Sister    Hyperlipidemia Brother    Colon cancer Neg Hx    Pancreatic cancer Neg Hx    Breast cancer Neg Hx     Social History   Socioeconomic History   Marital status: Divorced    Spouse name: Not on file   Number of children: Not on file   Years of education: Not on file   Highest education level: Not on file  Occupational History   Not on file  Tobacco Use   Smoking status: Former    Years: 15.00    Types: Cigarettes, Cigars    Start date: 12/21/1964    Quit date: 08/15/2017    Years since quitting: 3.4   Smokeless tobacco: Former    Types: Chew    Quit date: 11/12/2019  Vaping Use   Vaping Use: Never used  Substance and Sexual Activity   Alcohol use: Yes    Alcohol/week: 0.0 standard drinks    Comment: occasional   Drug use: No   Sexual activity: Yes    Partners: Female  Other Topics Concern   Not  on file  Social History Narrative   Not on file   Social Determinants of Health   Financial Resource Strain: Not on file  Food Insecurity: Not on file  Transportation Needs: Not on file  Physical Activity: Not on file  Stress: Not on file  Social Connections: Not on file  Intimate Partner Violence: Not on file    No Known Allergies  Current Outpatient Medications  Medication Sig Dispense Refill   Evolocumab (REPATHA SURECLICK) 503 MG/ML SOAJ Inject 140 mg into the skin every 14 (fourteen) days. 6 mL 3   metoprolol tartrate (LOPRESSOR) 50 MG tablet Take 1 tablet (50 mg total) by mouth daily. 90 tablet 3   tamsulosin (FLOMAX) 0.4 MG CAPS capsule Take 0.4 mg by mouth.     valsartan (DIOVAN) 160 MG tablet Take 1 tablet (160 mg total) by mouth daily. 90 tablet 3   No current  facility-administered medications for this visit.    REVIEW OF SYSTEMS:  [X]  denotes positive finding, [ ]  denotes negative finding Cardiac  Comments:  Chest pain or chest pressure:    Shortness of breath upon exertion:    Short of breath when lying flat:    Irregular heart rhythm:        Vascular    Pain in calf, thigh, or hip brought on by ambulation:    Pain in feet at night that wakes you up from your sleep:     Blood clot in your veins:    Leg swelling:         Pulmonary    Oxygen at home:    Productive cough:     Wheezing:         Neurologic    Sudden weakness in arms or legs:     Sudden numbness in arms or legs:     Sudden onset of difficulty speaking or slurred speech:    Temporary loss of vision in one eye:     Problems with dizziness:         Gastrointestinal    Blood in stool:     Vomited blood:         Genitourinary    Burning when urinating:     Blood in urine:        Psychiatric    Major depression:         Hematologic    Bleeding problems:    Problems with blood clotting too easily:        Skin    Rashes or ulcers:        Constitutional    Fever or chills:       PHYSICAL EXAM Vitals:   01/18/21 1027  BP: 127/74  Pulse: 74  Resp: 18  Temp: 98.4 F (36.9 C)  TempSrc: Temporal  SpO2: 99%  Weight: 213 lb (96.6 kg)  Height: 5\' 9"  (1.753 m)    Constitutional: well appearing. no distress. Appears well nourished.  Neurologic: CN intact. no focal findings. no sensory loss. Psychiatric:  Mood and affect symmetric and appropriate. Eyes:  No icterus. No conjunctival pallor. Ears, nose, throat:  mucous membranes moist. Midline trachea.  Cardiac: regular rate and rhythm.  Respiratory:  unlabored. Abdominal:  soft, non-tender, non-distended.  Peripheral vascular: 1+ DP bilaterally. Extremity: no edema. no cyanosis. no pallor.  Skin: no gangrene. no ulceration.  Lymphatic: no Stemmer's sign. no palpable lymphadenopathy.  PERTINENT LABORATORY AND RADIOLOGIC DATA  Most recent CBC CBC Latest Ref Rng & Units 06/08/2020 12/18/2018 08/17/2011  WBC 4.0 - 10.5 K/uL 6.0 6.3 9.8  Hemoglobin 13.0 - 17.0 g/dL 13.8 13.4 13.4  Hematocrit 39.0 - 52.0 % 44.6 41.8 38.5(L)  Platelets 150 - 400 K/uL 215 249 226     Most recent CMP CMP Latest Ref Rng & Units 12/30/2020 06/08/2020 12/18/2018  Glucose 70 - 99 mg/dL 116(H) 108(H) 117(H)  BUN 8 - 23 mg/dL 20 18 20   Creatinine 0.61 - 1.24 mg/dL 1.80(H) 1.66(H) 1.65(H)  Sodium 135 - 145 mmol/L 137 138 136  Potassium 3.5 - 5.1 mmol/L 4.3 3.7 4.1  Chloride 98 - 111 mmol/L 106 107 106  CO2 22 - 32 mmol/L 24 18(L) 23  Calcium 8.9 - 10.3 mg/dL 9.2 9.0 9.0  Total Protein 6.5 - 8.1 g/dL - 7.6 7.4  Total Bilirubin 0.3 - 1.2 mg/dL - 0.8 0.4  Alkaline Phos 38 - 126 U/L - 58 48  AST  15 - 41 U/L - 22 21  ALT 0 - 44 U/L - 19 24    Renal function Estimated Creatinine Clearance: 43.2 mL/min (A) (by C-G formula based on SCr of 1.8 mg/dL (H)).  Hgb A1c MFr Bld (%)  Date Value  08/16/2011 7.2 (H)    LDL Cholesterol  Date Value Ref Range Status  08/17/2011 226 (H) 0 - 99 mg/dL Final    Comment:           Total  Cholesterol/HDL:CHD Risk Coronary Heart Disease Risk Table                     Men   Women  1/2 Average Risk   3.4   3.3  Average Risk       5.0   4.4  2 X Average Risk   9.6   7.1  3 X Average Risk  23.4   11.0        Use the calculated Patient Ratio above and the CHD Risk Table to determine the patient's CHD Risk.        ATP III CLASSIFICATION (LDL):  <100     mg/dL   Optimal  100-129  mg/dL   Near or Above                    Optimal  130-159  mg/dL   Borderline  160-189  mg/dL   High  >190     mg/dL   Very High     CLINICAL DATA:  History of abdominal aortic aneurysm with prior outside retroperitoneal ultrasound apparently demonstrating a roughly 5.5 cm abdominal aortic aneurysm in April, 2021. This imaging is not available for comparison.   EXAM: ULTRASOUND OF ABDOMINAL AORTA   TECHNIQUE: Ultrasound examination of the abdominal aorta and proximal common iliac arteries was performed to evaluate for aneurysm. Additional color and Doppler images of the distal aorta were obtained to document patency.   COMPARISON:  None.   FINDINGS: Abdominal aortic measurements as follows:   Proximal:  2.5 x 2.6 cm   Mid:  2.1 x 2.2 cm   Distal:  3.8 x 4.2 cm Patent: Yes, peak systolic velocity is 782 cm/s   Focal dilatation of the distal abdominal aorta terminates at the aortic bifurcation.   Right common iliac artery: 1.4 cm   Left common iliac artery: 1.4 cm   IMPRESSION: Distal abdominal aortic aneurysm measures approximately 4.2 cm in greatest diameter by aortic ultrasound.   Recommend follow-up every 12 months and vascular consultation.   Reference: J Am Coll Radiol 9562;[id number].     Electronically Signed   By: Aletta Edouard M.D.   On: 01/04/2021 14:51  Yevonne Aline. Stanford Breed, MD [facility] Phone Number: [phone] 01/18/2021 10:51 AM  Total time spent on preparing this encounter including chart review, data  review, collecting history, examining the patient, coordinating care for this new patient, 60 minutes.  Portions of this report may have been transcribed using voice recognition software.  Every effort has been made to ensure accuracy; however, inadvertent computerized transcription errors may still be present.

## 2021-01-18 ENCOUNTER — Encounter: Payer: Self-pay | Admitting: Vascular Surgery

## 2021-01-18 ENCOUNTER — Ambulatory Visit: Payer: Medicare HMO | Admitting: Vascular Surgery

## 2021-01-18 ENCOUNTER — Other Ambulatory Visit: Payer: Self-pay

## 2021-01-18 VITALS — BP 127/74 | HR 74 | Temp 98.4°F | Resp 18 | Ht 69.0 in | Wt 213.0 lb

## 2021-01-18 DIAGNOSIS — I7143 Infrarenal abdominal aortic aneurysm, without rupture: Secondary | ICD-10-CM | POA: Diagnosis not present

## 2021-01-20 ENCOUNTER — Telehealth: Payer: Self-pay | Admitting: Cardiology

## 2021-01-20 NOTE — Telephone Encounter (Signed)
Pt c/o medication issue:  1. Name of Medication:valsartan (DIOVAN) 160 MG tablet  2. How are you currently taking this medication (dosage and times per day)? As Directed  3. Are you having a reaction (difficulty breathing--STAT)? NO  4. What is your medication issue? PT STATES HE WAS ADVISED BY HEART CARE DURING HIS MOST RECENT APPT 12/30/20 THAT HE NEEDED TO CHANGE THE DOSAGE OF THIS MEDICINE BECAUSE IT WAS MESSING UP HIS KIDNEY. PT ALSO STATES HE WAS ADVISED THAT OUR OFFICE WAS GOING TO REACH OUT TO HIS PCP, HE CALLED HIS PCP AND THEY ARE NOT AWARE OF THIS.

## 2021-01-21 NOTE — Telephone Encounter (Signed)
Pt is returning call.  

## 2021-01-21 NOTE — Telephone Encounter (Signed)
Lab results were sent to pcp on 12/31/20. I re-faxed again. His pcp is not Gordy Levan, it is Westley Chandler     Left message for patient to return call to discuss.

## 2021-01-21 NOTE — Telephone Encounter (Signed)
I spoke with patient and let him know that we re-faxed his labs again to his provider, Westley Chandler at [facility] and to expect a response back from them.

## 2021-04-06 ENCOUNTER — Other Ambulatory Visit: Payer: Self-pay

## 2021-04-06 ENCOUNTER — Other Ambulatory Visit: Payer: Medicare HMO

## 2021-04-07 LAB — PSA: Prostate Specific Ag, Serum: 1.7 ng/mL (ref 0.0–4.0)

## 2021-04-13 ENCOUNTER — Ambulatory Visit: Payer: Medicare HMO | Admitting: Urology

## 2021-04-13 NOTE — Progress Notes (Signed)
Results mailed to patient;.

## 2021-05-03 NOTE — Progress Notes (Signed)
History of Present Illness:   Here for follow-up of prostate cancer, treated  12.7.2021: TRUS/Bx. PSA 5.7, prostate volume 49.76 mL. PSAD 0.11. 8/12 cores positive for adenocarcinoma. 6 cores w/ GS 3+3 pattern-- Lt mid lateral 5% of core, Lt apex lateral 30% of core,  Rt apex medial, 5% of core,  Rt apex lateral, 20% of core, Rt mid lateral, 5% of core, Rt base lateral, 10% of core 2 cores revealed GS 3+4 pattern --Rt mid medial, 10% of core,  Lt base lateral, 10% of core   3.17.2022: Underwent I 1225 brachytherapy and SpaceOAR placement.  2.7.2023: PSA 1.7.  He has been voiding better.  Last visit IPSS score was 11.  He is on tamsulosin.  Now his score is 6.  He has not had blood in his urine or stool.  Does have sildenafil which he does not use very often. Past Medical History:  Diagnosis Date   AAA (abdominal aortic aneurysm)    per cardiology note , ultrasound retroperitoneal 07-22-2019,  5.5cm   Arthritis    Coronary artery disease cardiologist--- Bernerd Pho PA----  (06-08-2020  pt denies any cardiac s&s, sob, and no peripheral swelling with exception chest pain with actos medication which chest pain stopped)   hx Inferolateral STEMI 05/ 21/ 2013  s/p PCI and DES to distal RCA and OM1 ,  and chronic occlusion mid RCA with left to right collecterals , preserved LVSF with inferior wall hypokinesis   History of 2019 novel coronavirus disease (COVID-19) 04/2020   per pt positive covid at urgent care but unable to obatin result prior to surgery 06-11-2020;  pt stated had mild symptoms that resolved in a week   History of ST elevation myocardial infarction (STEMI) 07/2011   inferolateral s/p PCI and stenting   Hyperlipidemia    Hypertension    Prostate cancer [facility]) urologist--- dr Tamika Shropshire/  oncologist---- dr Tammi Klippel   dx 03-24-2020, Gleason 3+4, Stage T1c, PSA 5.7   S/P drug eluting coronary stent placement 08/16/2011   PCI and DES x2  diatal RCA and OM1   Type 2 diabetes mellitus  ([place])    followed by pcp per pt ,  (06-08-2020 pt stated was taking actos 15mg  daily, but stopped taking about 4-6 wks ago due to causing leg and chest pain,  stated will tell his pcp at appointment 06-15-2020,  stated he watching is diet)    Past Surgical History:  Procedure Laterality Date   COLONOSCOPY WITH PROPOFOL N/A 01/18/2018   Procedure: COLONOSCOPY WITH PROPOFOL;  Surgeon: Daneil Dolin, MD;  [facility];  Service: Endoscopy;  Laterality: N/A;  10:15am   CYSTOSCOPY  06/11/2020   Procedure: CYSTOSCOPY FLEXIBLE;  Surgeon: [name], MD;  Location: [facility];  Service: Urology;;  NO SEEDS FOUND IN BLADDER   LEFT HEART CATHETERIZATION WITH CORONARY ANGIOGRAM N/A 08/16/2011   Procedure: LEFT HEART CATHETERIZATION WITH CORONARY ANGIOGRAM;  Surgeon: Peter M Martinique, MD;  Location: [facility] CATH LAB;  Service: Cardiovascular;  Laterality: N/A;   POLYPECTOMY  01/18/2018   Procedure: POLYPECTOMY;  Surgeon: Daneil Dolin, MD;  [facility];  Service: Endoscopy;;  colon   PROSTATE BIOPSY  03-24-2020  office   RADIOACTIVE SEED IMPLANT N/A 06/11/2020   Procedure: RADIOACTIVE SEED IMPLANT/BRACHYTHERAPY IMPLANT;  Surgeon: [name], MD;  Location: [facility];  Service: Urology;  Laterality: N/A;   67    SEEDS IMPLANTED   SPACE OAR INSTILLATION N/A 06/11/2020  Procedure: SPACE OAR INSTILLATION;  Surgeon: [name], MD;  Location: [facility];  Service: Urology;  Laterality: N/A;    Home Medications:  Allergies as of 05/04/2021   No Known Allergies      Medication List        Accurate as of May 03, 2021  8:01 PM. If you have any questions, ask your nurse or doctor.          metoprolol tartrate 50 MG tablet Commonly known as: LOPRESSOR Take 1 tablet (50 mg total) by mouth daily.   Repatha SureClick 166 MG/ML Soaj Generic drug: Evolocumab Inject 140 mg into the skin every 14 (fourteen)  days.   tamsulosin 0.4 MG Caps capsule Commonly known as: FLOMAX Take 0.4 mg by mouth.   valsartan 160 MG tablet Commonly known as: DIOVAN Take 1 tablet (160 mg total) by mouth daily.        Allergies: No Known Allergies  Family History  Problem Relation Age of Onset   Diabetes Brother    Prostate cancer Brother    Diabetes Sister    Hypertension Brother    Hypertension Sister    Hyperlipidemia Sister    Hyperlipidemia Brother    Colon cancer Neg Hx    Pancreatic cancer Neg Hx    Breast cancer Neg Hx     Social History:  reports that he quit smoking about 3 years ago. His smoking use included cigarettes and cigars. He started smoking about 56 years ago. He quit smokeless tobacco use about 17 months ago.  His smokeless tobacco use included chew. He reports current alcohol use. He reports that he does not use drugs.  ROS: A complete review of systems was performed.  All systems are negative except for pertinent findings as noted.  Physical Exam:  Vital signs in last 24 hours: There were no vitals taken for this visit. Constitutional:  Alert and oriented, No acute distress Cardiovascular: Regular rate  Respiratory: Normal respiratory effort Neurologic: Grossly intact, no focal deficits Psychiatric: Normal mood and affect  I have reviewed prior pt notes  I have reviewed notes from referring/previous physicians  I have reviewed urinalysis results-urine clear today  I have reviewed prior pathology/PSA results    Impression/Assessment:  History of favorable intermediate risk prostate cancer, status post brachytherapy in March, 2022.  Stable PSA decline  ED, on sildenafil  BPH symptoms, doing well on tamsulosin  Plan:  I will check him in 6 months now, following PSA

## 2021-05-04 ENCOUNTER — Encounter: Payer: Self-pay | Admitting: Urology

## 2021-05-04 ENCOUNTER — Other Ambulatory Visit: Payer: Self-pay

## 2021-05-04 ENCOUNTER — Ambulatory Visit: Payer: Medicare HMO | Admitting: Urology

## 2021-05-04 VITALS — BP 139/78 | HR 84 | Wt 215.0 lb

## 2021-05-04 DIAGNOSIS — Z8546 Personal history of malignant neoplasm of prostate: Secondary | ICD-10-CM

## 2021-05-04 LAB — URINALYSIS, ROUTINE W REFLEX MICROSCOPIC
Bilirubin, UA: NEGATIVE
Glucose, UA: NEGATIVE
Ketones, UA: NEGATIVE
Leukocytes,UA: NEGATIVE
Nitrite, UA: NEGATIVE
Protein,UA: NEGATIVE
RBC, UA: NEGATIVE
Specific Gravity, UA: 1.02 (ref 1.005–1.030)
Urobilinogen, Ur: 0.2 mg/dL (ref 0.2–1.0)
pH, UA: 5.5 (ref 5.0–7.5)

## 2021-07-16 ENCOUNTER — Telehealth: Payer: Self-pay | Admitting: Cardiology

## 2021-07-16 DIAGNOSIS — Z024 Encounter for examination for driving license: Secondary | ICD-10-CM

## 2021-07-16 NOTE — Telephone Encounter (Signed)
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 He was last saw 12/2020. Can this be ordered? ?

## 2021-07-19 NOTE — Telephone Encounter (Signed)
Attempted to contact pt, no answer/no voice mail  ?

## 2021-07-19 NOTE — Telephone Encounter (Signed)
Spoke to pt who will come by [place] office to pick up instructions for Lexi. Pt needs nuc stress test scheduled befor May 5th. Will send to scheduling team.  ?

## 2021-07-19 NOTE — Telephone Encounter (Signed)
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 Letter in pt's chart.  ?

## 2021-07-23 ENCOUNTER — Ambulatory Visit (HOSPITAL_COMMUNITY)
Admission: RE | Admit: 2021-07-23 | Discharge: 2021-07-23 | Disposition: A | Discharge status: Home / Self Care | Payer: Medicare HMO | Source: Ambulatory Visit | Attending: Cardiology | Admitting: Cardiology

## 2021-07-23 ENCOUNTER — Encounter (HOSPITAL_COMMUNITY)
Admission: RE | Admit: 2021-07-23 | Discharge: 2021-07-23 | Disposition: A | Discharge status: Home / Self Care | Payer: Medicare HMO | Source: Ambulatory Visit | Attending: Cardiology | Admitting: Cardiology

## 2021-07-23 DIAGNOSIS — I491 Atrial premature depolarization: Secondary | ICD-10-CM | POA: Diagnosis not present

## 2021-07-23 DIAGNOSIS — I2510 Atherosclerotic heart disease of native coronary artery without angina pectoris: Secondary | ICD-10-CM

## 2021-07-23 DIAGNOSIS — Z024 Encounter for examination for driving license: Secondary | ICD-10-CM | POA: Insufficient documentation

## 2021-07-23 DIAGNOSIS — I2589 Other forms of chronic ischemic heart disease: Secondary | ICD-10-CM | POA: Insufficient documentation

## 2021-07-23 DIAGNOSIS — R9431 Abnormal electrocardiogram [ECG] [EKG]: Secondary | ICD-10-CM | POA: Insufficient documentation

## 2021-07-23 LAB — NM MYOCAR MULTI W/SPECT W/WALL MOTION / EF
LV dias vol: 95 mL (ref 62–150)
LV sys vol: 35 mL
Nuc Stress EF: 63 %
Peak HR: 110 {beats}/min
RATE: 0.3
Rest HR: 73 {beats}/min
Rest Nuclear Isotope Dose: 10.8 mCi
SDS: 2
SRS: 5
SSS: 7
ST Depression (mm): 0 mm
Stress Nuclear Isotope Dose: 31 mCi
TID: 1.04

## 2021-07-23 MED ORDER — TECHNETIUM TC 99M TETROFOSMIN IV KIT
30.0000 | PACK | Freq: Once | INTRAVENOUS | Status: AC | PRN
  Administered 2021-07-23: 31 via INTRAVENOUS

## 2021-07-23 MED ORDER — SODIUM CHLORIDE FLUSH 0.9 % IV SOLN
INTRAVENOUS | Status: AC
  Administered 2021-07-23: 10 mL
  Filled 2021-07-23: qty 10

## 2021-07-23 MED ORDER — REGADENOSON 0.4 MG/5ML IV SOLN
INTRAVENOUS | Status: AC
  Administered 2021-07-23: 0.4 mg
  Filled 2021-07-23: qty 5

## 2021-07-23 MED ORDER — TECHNETIUM TC 99M TETROFOSMIN IV KIT
10.0000 | PACK | Freq: Once | INTRAVENOUS | Status: AC | PRN
  Administered 2021-07-23: 10.81 via INTRAVENOUS

## 2021-07-23 NOTE — Telephone Encounter (Signed)
-----   Message from Sueanne Margarita, MD sent at 07/23/2021  4:06 PM EDT ----- ?Low risk nuclear stress test.  Please find out if he had any chest pain or shortness of breath.  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

## 2021-07-23 NOTE — Telephone Encounter (Signed)
Pt stated he was not having CP or SOB. Pt notified of results. Pt will come to the office to obtain copy of stress test Monday ?

## 2021-07-26 ENCOUNTER — Telehealth: Payer: Self-pay | Admitting: Cardiology

## 2021-07-26 NOTE — Telephone Encounter (Signed)
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 Fax: [phone] ?Phone: [phone] ? ?

## 2021-07-27 NOTE — Telephone Encounter (Signed)
Pt notified and verbalized understanding. Pt will contact Dr. Hazel Sams office for clearance.  ?

## 2021-10-26 ENCOUNTER — Other Ambulatory Visit: Payer: Medicare HMO

## 2021-11-02 ENCOUNTER — Ambulatory Visit: Payer: Medicare HMO | Admitting: Urology

## 2021-11-04 ENCOUNTER — Other Ambulatory Visit: Payer: Medicare HMO

## 2021-11-04 DIAGNOSIS — Z8546 Personal history of malignant neoplasm of prostate: Secondary | ICD-10-CM

## 2021-11-05 LAB — PSA: Prostate Specific Ag, Serum: 1.6 ng/mL (ref 0.0–4.0)

## 2021-11-09 ENCOUNTER — Ambulatory Visit: Payer: Medicare HMO | Admitting: Urology

## 2021-11-09 VITALS — BP 137/81 | HR 83 | Ht 69.0 in | Wt 206.0 lb

## 2021-11-09 DIAGNOSIS — Z8546 Personal history of malignant neoplasm of prostate: Secondary | ICD-10-CM | POA: Diagnosis not present

## 2021-11-09 DIAGNOSIS — N5201 Erectile dysfunction due to arterial insufficiency: Secondary | ICD-10-CM | POA: Diagnosis not present

## 2021-11-09 DIAGNOSIS — N401 Enlarged prostate with lower urinary tract symptoms: Secondary | ICD-10-CM | POA: Diagnosis not present

## 2021-11-09 DIAGNOSIS — N138 Other obstructive and reflux uropathy: Secondary | ICD-10-CM

## 2021-11-09 NOTE — Progress Notes (Signed)
History of Present Illness: Here for follow-up of prostate cancer, treated   12.7.2021: TRUS/Bx. PSA 5.7, prostate volume 49.76 mL. PSAD 0.11. 8/12 cores positive for adenocarcinoma. 6 cores w/ GS 3+3 pattern-- Lt mid lateral 5% of core, Lt apex lateral 30% of core,  Rt apex medial, 5% of core,  Rt apex lateral, 20% of core, Rt mid lateral, 5% of core, Rt base lateral, 10% of core 2 cores revealed GS 3+4 pattern --Rt mid medial, 10% of core,  Lt base lateral, 10% of core   3.17.2022: Underwent I 1225 brachytherapy and SpaceOAR placement.   2.7.2023: PSA 1.7  8.15.2023: PSA 1.6.  He denies any significant urologic issues.  IPSS 3, quality-of-life score 2.  He is on tamsulosin he denies significant issues.  He has not noted any blood in his stool or urine.   Past Medical History:  Diagnosis Date   AAA (abdominal aortic aneurysm)    per cardiology note , ultrasound retroperitoneal 07-22-2019,  5.5cm   Arthritis    Coronary artery disease cardiologist--- Bernerd Pho PA----  (06-08-2020  pt denies any cardiac s&s, sob, and no peripheral swelling with exception chest pain with actos medication which chest pain stopped)   hx Inferolateral STEMI 05/ 21/ 2013  s/p PCI and DES to distal RCA and OM1 ,  and chronic occlusion mid RCA with left to right collecterals , preserved LVSF with inferior wall hypokinesis   History of 2019 novel coronavirus disease (COVID-19) 04/2020   per pt positive covid at urgent care but unable to obatin result prior to surgery 06-11-2020;  pt stated had mild symptoms that resolved in a week   History of ST elevation myocardial infarction (STEMI) 07/2011   inferolateral s/p PCI and stenting   Hyperlipidemia    Hypertension    Prostate cancer [facility]) urologist--- dr Kimberly Coye/  oncologist---- dr Tammi Klippel   dx 03-24-2020, Gleason 3+4, Stage T1c, PSA 5.7   S/P drug eluting coronary stent placement 08/16/2011   PCI and DES x2  diatal RCA and OM1   Type 2 diabetes  mellitus ([place])    followed by pcp per pt ,  (06-08-2020 pt stated was taking actos '15mg'$  daily, but stopped taking about 4-6 wks ago due to causing leg and chest pain,  stated will tell his pcp at appointment 06-15-2020,  stated he watching is diet)    Past Surgical History:  Procedure Laterality Date   COLONOSCOPY WITH PROPOFOL N/A 01/18/2018   Procedure: COLONOSCOPY WITH PROPOFOL;  Surgeon: Daneil Dolin, MD;  [facility];  Service: Endoscopy;  Laterality: N/A;  10:15am   CYSTOSCOPY  06/11/2020   Procedure: CYSTOSCOPY FLEXIBLE;  Surgeon: [name], MD;  Location: [facility];  Service: Urology;;  NO SEEDS FOUND IN BLADDER   LEFT HEART CATHETERIZATION WITH CORONARY ANGIOGRAM N/A 08/16/2011   Procedure: LEFT HEART CATHETERIZATION WITH CORONARY ANGIOGRAM;  Surgeon: Peter M Martinique, MD;  Location: [facility] CATH LAB;  Service: Cardiovascular;  Laterality: N/A;   POLYPECTOMY  01/18/2018   Procedure: POLYPECTOMY;  Surgeon: Daneil Dolin, MD;  [facility];  Service: Endoscopy;;  colon   PROSTATE BIOPSY  03-24-2020  office   RADIOACTIVE SEED IMPLANT N/A 06/11/2020   Procedure: RADIOACTIVE SEED IMPLANT/BRACHYTHERAPY IMPLANT;  Surgeon: [name], MD;  Location: [facility];  Service: Urology;  Laterality: N/A;   67    SEEDS IMPLANTED   SPACE OAR INSTILLATION N/A 06/11/2020   Procedure: SPACE OAR INSTILLATION;  Surgeon:  [name], MD;  Location: [facility];  Service: Urology;  Laterality: N/A;    Home Medications:  Allergies as of 11/09/2021   No Known Allergies      Medication List        Accurate as of November 09, 2021  8:50 AM. If you have any questions, ask your nurse or doctor.          metoprolol tartrate 50 MG tablet Commonly known as: LOPRESSOR Take 1 tablet (50 mg total) by mouth daily.   Repatha SureClick 532 MG/ML Soaj Generic drug: Evolocumab Inject 140 mg into the skin every 14  (fourteen) days.   tamsulosin 0.4 MG Caps capsule Commonly known as: FLOMAX Take 0.4 mg by mouth.   valsartan 160 MG tablet Commonly known as: DIOVAN Take 1 tablet (160 mg total) by mouth daily.        Allergies: No Known Allergies  Family History  Problem Relation Age of Onset   Diabetes Brother    Prostate cancer Brother    Diabetes Sister    Hypertension Brother    Hypertension Sister    Hyperlipidemia Sister    Hyperlipidemia Brother    Colon cancer Neg Hx    Pancreatic cancer Neg Hx    Breast cancer Neg Hx     Social History:  reports that he quit smoking about 4 years ago. His smoking use included cigarettes and cigars. He started smoking about 56 years ago. He quit smokeless tobacco use about 1 years ago.  His smokeless tobacco use included chew. He reports current alcohol use. He reports that he does not use drugs.  ROS: A complete review of systems was performed.  All systems are negative except for pertinent findings as noted.  Physical Exam:  Vital signs in last 24 hours: There were no vitals taken for this visit. Constitutional:  Alert and oriented, No acute distress Cardiovascular: Regular rate  Respiratory: Normal respiratory effort Genitourinary:  Normal anal tone prostate smooth, flat. Lymphatic: No lymphadenopathy Neurologic: Grossly intact, no focal deficits Psychiatric: Normal mood and affect  I have reviewed prior pt notes  I have reviewed urinalysis results  I have independently reviewed prior imaging  I have reviewed prior PSA results.  Pathology results reviewed    Impression/Assessment:  History of grade group 2 prostate cancer, status post I-125 brachytherapy/SpaceOAR in March 2022.  Acceptable but slow decline in PSA with significant radiation related sequelae  BPH, doing well on alpha blockade  Plan:  I will see back in 6 months following PSA

## 2021-11-11 LAB — MICROSCOPIC EXAMINATION
Bacteria, UA: NONE SEEN
RBC, Urine: NONE SEEN /hpf (ref 0–2)

## 2021-11-11 LAB — URINALYSIS, ROUTINE W REFLEX MICROSCOPIC
Bilirubin, UA: NEGATIVE
Glucose, UA: NEGATIVE
Ketones, UA: NEGATIVE
Nitrite, UA: NEGATIVE
RBC, UA: NEGATIVE
Specific Gravity, UA: 1.025 (ref 1.005–1.030)
Urobilinogen, Ur: 0.2 mg/dL (ref 0.2–1.0)
pH, UA: 5 (ref 5.0–7.5)

## 2021-11-23 IMAGING — US US AORTA
1 series · 14 of 25 positions shown · non-contrast
Comparison: None.

CLINICAL DATA: History of abdominal aortic aneurysm with prior
outside retroperitoneal ultrasound apparently demonstrating a
roughly 5.5 cm abdominal aortic aneurysm in June 2019. This
imaging is not available for comparison.

EXAM:
ULTRASOUND OF ABDOMINAL AORTA
TECHNIQUE: Ultrasound examination of the abdominal aorta and proximal common
iliac arteries was performed to evaluate for aneurysm. Additional
color and Doppler images of the distal aorta were obtained to
document patency.

[Series 1: us aorta · 14 of 35 slices shown]
[im 1/35]
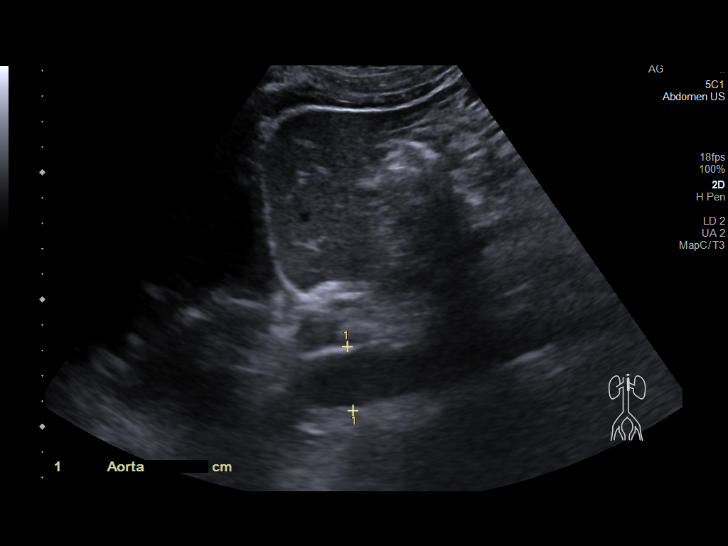
[im 3/35]
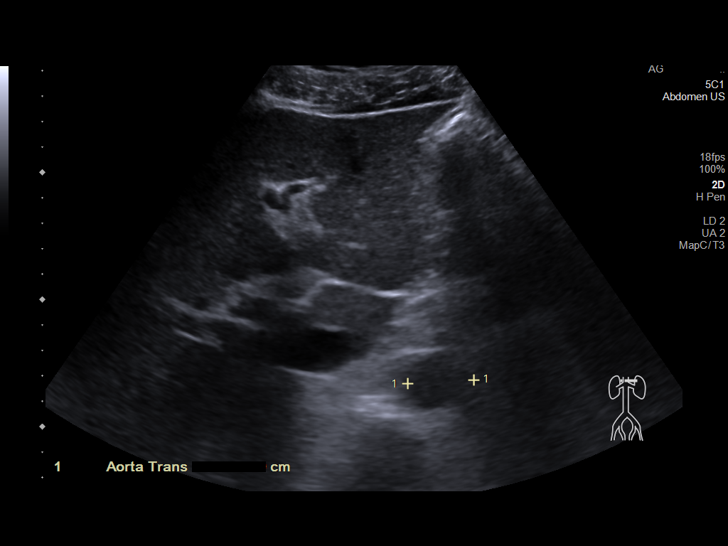
[im 6/35]
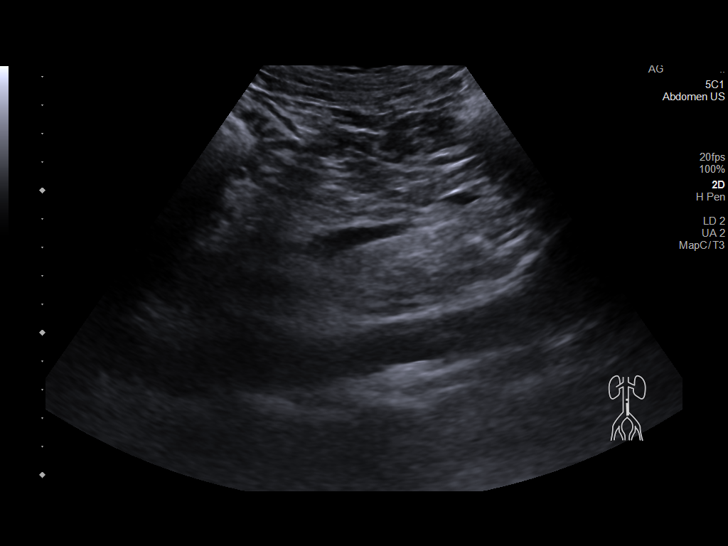
[im 9/35]
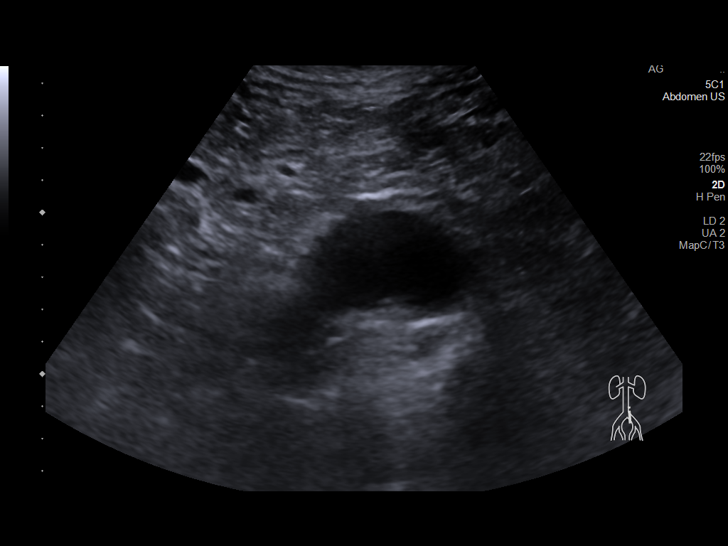
[im 12/35]
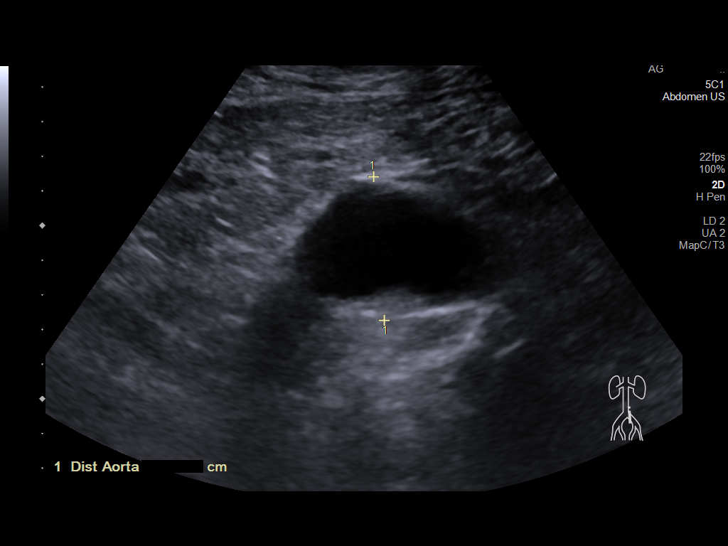
[im 13/35]
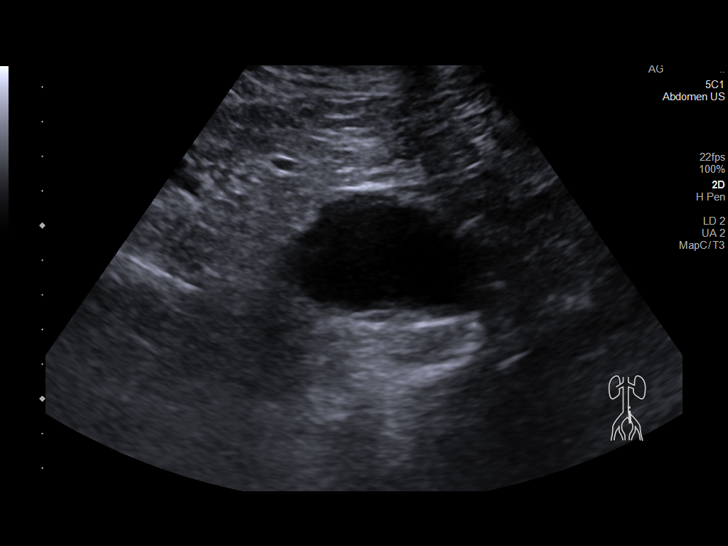
[im 16/35]
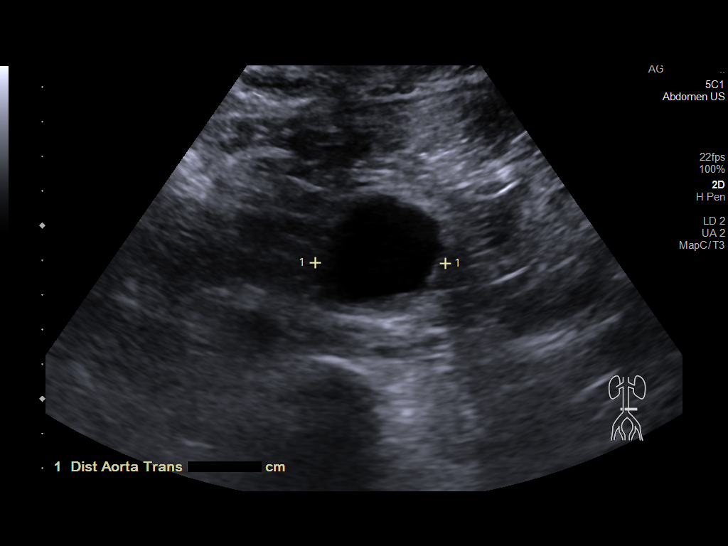
[im 19/35]
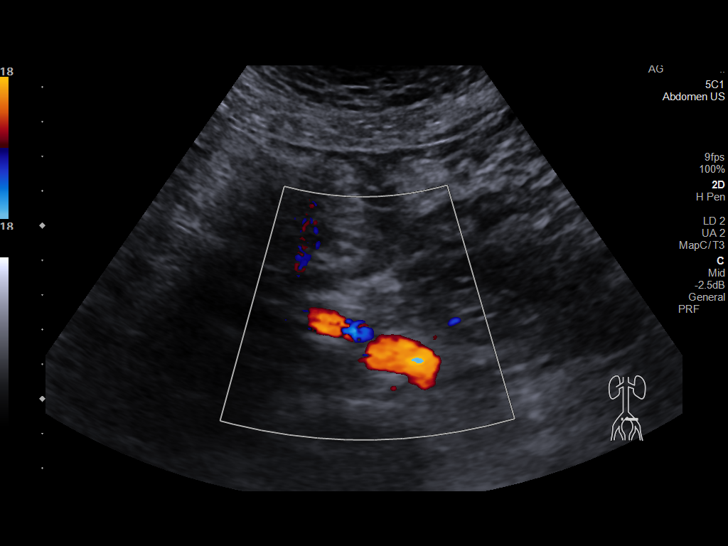
[im 22/35]
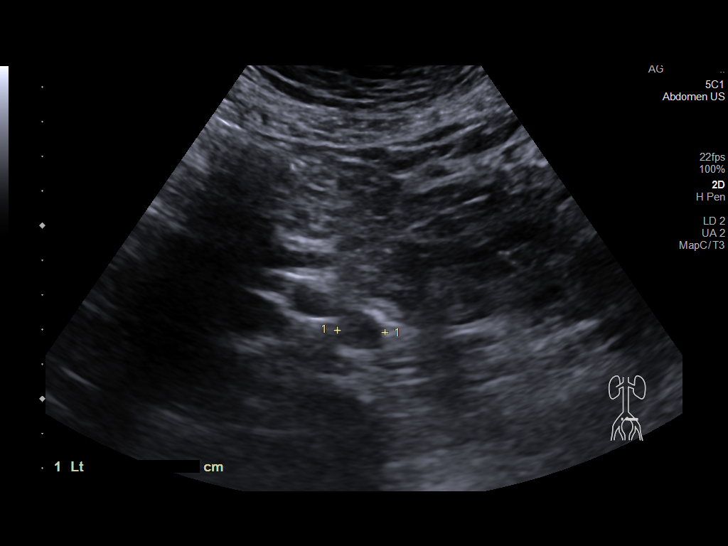
[im 23/35]
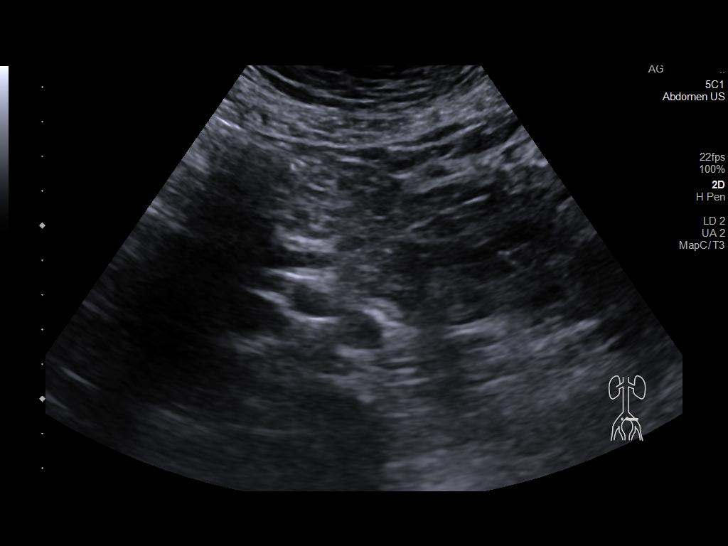
[im 26/35]
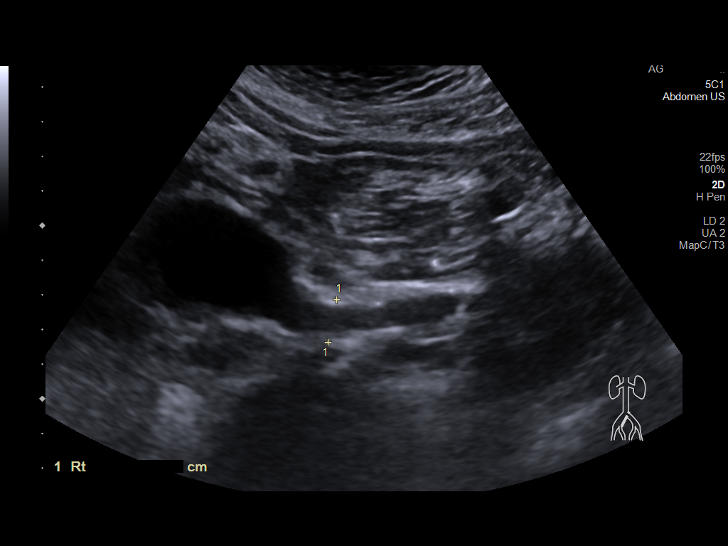
[im 29/35]
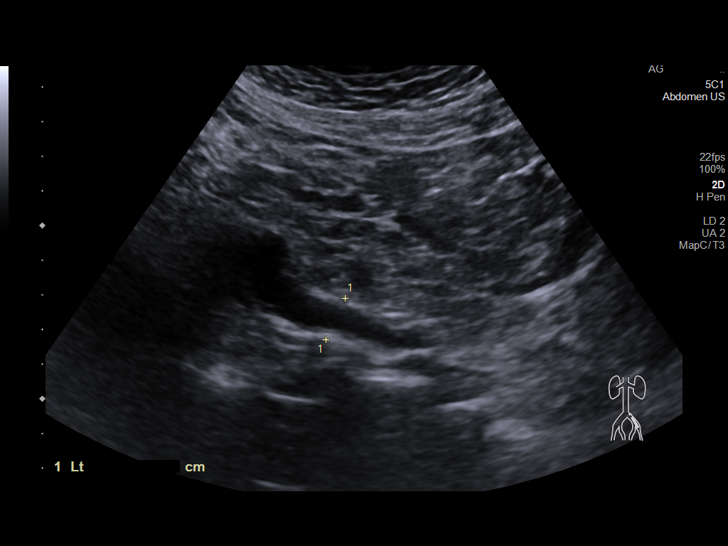
[im 32/35]
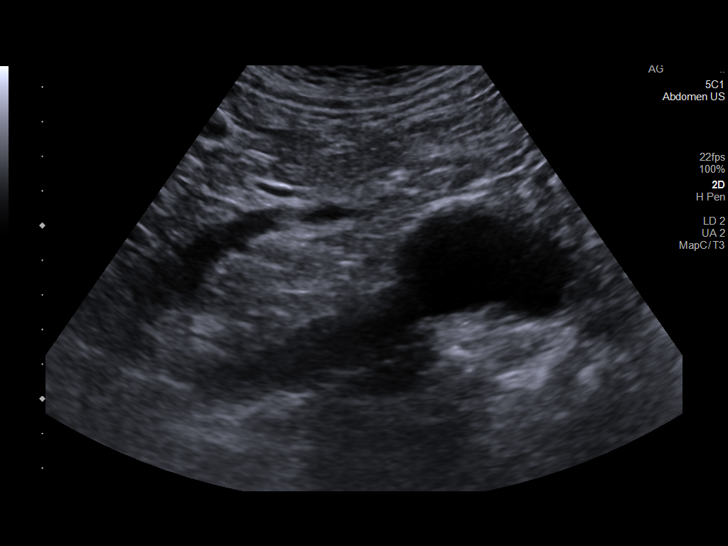
[im 35/35]
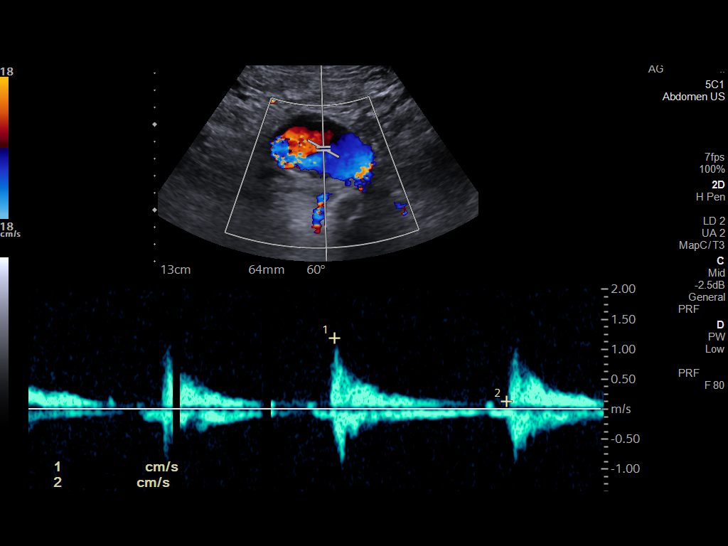

[14 of 25 positions shown; findings below may reference images not displayed]

FINDINGS: Abdominal aortic measurements as follows:

Proximal:  2.5 x 2.6 cm

Mid:  2.1 x 2.2 cm

Distal:  3.8 x 4.2 cm
Patent: Yes, peak systolic velocity is 130 cm/s

Focal dilatation of the distal abdominal aorta terminates at the
aortic bifurcation.

Right common iliac artery: 1.4 cm

Left common iliac artery: 1.4 cm
IMPRESSION: Distal abdominal aortic aneurysm measures approximately 4.2 cm in
greatest diameter by aortic ultrasound.

Recommend follow-up every 12 months and vascular consultation.

Reference: [HOSPITAL] 6933;[DATE].

## 2022-04-14 ENCOUNTER — Other Ambulatory Visit (HOSPITAL_COMMUNITY): Payer: Self-pay

## 2022-05-03 ENCOUNTER — Ambulatory Visit: Payer: Medicare HMO | Admitting: Urology

## 2022-05-31 ENCOUNTER — Other Ambulatory Visit: Payer: Self-pay

## 2022-05-31 ENCOUNTER — Other Ambulatory Visit: Payer: Medicare HMO

## 2022-05-31 DIAGNOSIS — Z8546 Personal history of malignant neoplasm of prostate: Secondary | ICD-10-CM

## 2022-06-01 LAB — PSA: Prostate Specific Ag, Serum: 1.3 ng/mL (ref 0.0–4.0)

## 2022-06-06 NOTE — Progress Notes (Signed)
History of Present Illness:  Here for follow-up of prostate cancer, treated   12.7.2021: TRUS/Bx. PSA 5.7, prostate volume 49.76 mL. PSAD 0.11. 8/12 cores positive for adenocarcinoma. 6 cores w/ GS 3+3 pattern-- Lt mid lateral 5% of core, Lt apex lateral 30% of core,  Rt apex medial, 5% of core,  Rt apex lateral, 20% of core, Rt mid lateral, 5% of core, Rt base lateral, 10% of core 2 cores revealed GS 3+4 pattern --Rt mid medial, 10% of core,  Lt base lateral, 10% of core   3.17.2022: Underwent I 1225 brachytherapy and SpaceOAR placement.  3.12.2024: PSA 1.3.  He is having a slower stream since his Flomax prescription ran out.  Still waiting for to be sent from his mail order pharmacy.  IPSS 7, quality-of-life score 1.  No blood in his urine or stool.  Would like trial sample of Viagra.  Past Medical History:  Diagnosis Date   AAA (abdominal aortic aneurysm)    per cardiology note , ultrasound retroperitoneal 07-22-2019,  5.5cm   Arthritis    Coronary artery disease cardiologist--- Bernerd Pho PA----  (06-08-2020  pt denies any cardiac s&s, sob, and no peripheral swelling with exception chest pain with actos medication which chest pain stopped)   hx Inferolateral STEMI 05/ 21/ 2013  s/p PCI and DES to distal RCA and OM1 ,  and chronic occlusion mid RCA with left to right collecterals , preserved LVSF with inferior wall hypokinesis   History of 2019 novel coronavirus disease (COVID-19) 04/2020   per pt positive covid at urgent care but unable to obatin result prior to surgery 06-11-2020;  pt stated had mild symptoms that resolved in a week   History of ST elevation myocardial infarction (STEMI) 07/2011   inferolateral s/p PCI and stenting   Hyperlipidemia    Hypertension    Prostate cancer [facility]) urologist--- dr Adnan Vanvoorhis/  oncologist---- dr Tammi Klippel   dx 03-24-2020, Gleason 3+4, Stage T1c, PSA 5.7   S/P drug eluting coronary stent placement 08/16/2011   PCI and DES x2  diatal RCA  and OM1   Type 2 diabetes mellitus ([place])    followed by pcp per pt ,  (06-08-2020 pt stated was taking actos '15mg'$  daily, but stopped taking about 4-6 wks ago due to causing leg and chest pain,  stated will tell his pcp at appointment 06-15-2020,  stated he watching is diet)    Past Surgical History:  Procedure Laterality Date   COLONOSCOPY WITH PROPOFOL N/A 01/18/2018   Procedure: COLONOSCOPY WITH PROPOFOL;  Surgeon: Daneil Dolin, MD;  [facility];  Service: Endoscopy;  Laterality: N/A;  10:15am   CYSTOSCOPY  06/11/2020   Procedure: CYSTOSCOPY FLEXIBLE;  Surgeon: [name], MD;  Location: [facility];  Service: Urology;;  NO SEEDS FOUND IN BLADDER   LEFT HEART CATHETERIZATION WITH CORONARY ANGIOGRAM N/A 08/16/2011   Procedure: LEFT HEART CATHETERIZATION WITH CORONARY ANGIOGRAM;  Surgeon: Peter M Martinique, MD;  Location: [facility] CATH LAB;  Service: Cardiovascular;  Laterality: N/A;   POLYPECTOMY  01/18/2018   Procedure: POLYPECTOMY;  Surgeon: Daneil Dolin, MD;  [facility];  Service: Endoscopy;;  colon   PROSTATE BIOPSY  03-24-2020  office   RADIOACTIVE SEED IMPLANT N/A 06/11/2020   Procedure: RADIOACTIVE SEED IMPLANT/BRACHYTHERAPY IMPLANT;  Surgeon: [name], MD;  Location: [facility];  Service: Urology;  Laterality: N/A;   67    SEEDS IMPLANTED   SPACE OAR INSTILLATION N/A 06/11/2020  Procedure: SPACE OAR INSTILLATION;  Surgeon: [name], MD;  Location: [facility];  Service: Urology;  Laterality: N/A;    Home Medications:  Allergies as of 06/07/2022   No Known Allergies      Medication List        Accurate as of June 06, 2022 10:53 AM. If you have any questions, ask your nurse or doctor.          metoprolol tartrate 50 MG tablet Commonly known as: LOPRESSOR Take 1 tablet (50 mg total) by mouth daily.   Repatha SureClick XX123456 MG/ML Soaj Generic drug: Evolocumab Inject 140 mg into  the skin every 14 (fourteen) days.   tamsulosin 0.4 MG Caps capsule Commonly known as: FLOMAX Take 0.4 mg by mouth.   valsartan 160 MG tablet Commonly known as: DIOVAN Take 1 tablet (160 mg total) by mouth daily.        Allergies: No Known Allergies  Family History  Problem Relation Age of Onset   Diabetes Brother    Prostate cancer Brother    Diabetes Sister    Hypertension Brother    Hypertension Sister    Hyperlipidemia Sister    Hyperlipidemia Brother    Colon cancer Neg Hx    Pancreatic cancer Neg Hx    Breast cancer Neg Hx     Social History:  reports that he quit smoking about 4 years ago. His smoking use included cigarettes and cigars. He started smoking about 57 years ago. He quit smokeless tobacco use about 2 years ago.  His smokeless tobacco use included chew. He reports current alcohol use. He reports that he does not use drugs.  ROS: A complete review of systems was performed.  All systems are negative except for pertinent findings as noted.  Physical Exam:  Vital signs in last 24 hours: There were no vitals taken for this visit. Constitutional:  Alert and oriented, No acute distress Cardiovascular: Regular rate  Respiratory: Normal respiratory effort  Neurologic: Grossly intact, no focal deficits Psychiatric: Normal mood and affect  I have reviewed prior pt notes  I have reviewed urinalysis results  I have independently reviewed prior imaging  I have reviewed prior PSA and pathology results  IPSS sheet reviewed  Prostate ultrasound results reviewed.     Impression/Assessment:  1.  Grade group 2 prostate cancer, status post I-125 brachytherapy 2 years ago.  Acceptable but slow PSA trend  2.  BPH with worsening symptoms off of tamsulosin  3.  ED  Plan:  1.  New prescription for tamsulosin sent in  2.  He was also given a prescription for sildenafil  3.  I will see back in 3 months for repeat PSA/exam

## 2022-06-07 ENCOUNTER — Ambulatory Visit: Payer: Medicare HMO | Admitting: Urology

## 2022-06-07 VITALS — BP 147/76 | HR 80

## 2022-06-07 DIAGNOSIS — N401 Enlarged prostate with lower urinary tract symptoms: Secondary | ICD-10-CM | POA: Diagnosis not present

## 2022-06-07 DIAGNOSIS — N5201 Erectile dysfunction due to arterial insufficiency: Secondary | ICD-10-CM

## 2022-06-07 DIAGNOSIS — Z8546 Personal history of malignant neoplasm of prostate: Secondary | ICD-10-CM

## 2022-06-07 LAB — MICROSCOPIC EXAMINATION: Bacteria, UA: NONE SEEN

## 2022-06-07 LAB — URINALYSIS, ROUTINE W REFLEX MICROSCOPIC
Bilirubin, UA: NEGATIVE
Glucose, UA: NEGATIVE
Ketones, UA: NEGATIVE
Nitrite, UA: NEGATIVE
Protein,UA: NEGATIVE
RBC, UA: NEGATIVE
Specific Gravity, UA: 1.015 (ref 1.005–1.030)
Urobilinogen, Ur: 0.2 mg/dL (ref 0.2–1.0)
pH, UA: 6.5 (ref 5.0–7.5)

## 2022-06-07 MED ORDER — TAMSULOSIN HCL 0.4 MG PO CAPS: 0.4000 mg | ORAL_CAPSULE | Freq: Every day | ORAL | 3 refills | Status: DC

## 2022-06-07 MED ORDER — SILDENAFIL CITRATE 100 MG PO TABS: ORAL_TABLET | ORAL | 99 refills | Status: DC

## 2022-07-18 ENCOUNTER — Other Ambulatory Visit: Payer: Self-pay | Admitting: *Deleted

## 2022-07-18 DIAGNOSIS — I7143 Infrarenal abdominal aortic aneurysm, without rupture: Secondary | ICD-10-CM

## 2022-07-26 ENCOUNTER — Ambulatory Visit: Payer: Medicare HMO | Admitting: Physician Assistant

## 2022-07-26 ENCOUNTER — Encounter: Payer: Self-pay | Admitting: Physician Assistant

## 2022-07-26 ENCOUNTER — Ambulatory Visit (HOSPITAL_COMMUNITY)
Admission: RE | Admit: 2022-07-26 | Discharge: 2022-07-26 | Disposition: A | Discharge status: Home / Self Care | Payer: Medicare HMO | Source: Ambulatory Visit | Attending: Vascular Surgery | Admitting: Vascular Surgery

## 2022-07-26 VITALS — BP 151/86 | HR 60 | Temp 98.6°F | Resp 20 | Ht 69.0 in | Wt 212.1 lb

## 2022-07-26 DIAGNOSIS — I7143 Infrarenal abdominal aortic aneurysm, without rupture: Secondary | ICD-10-CM | POA: Diagnosis present

## 2022-07-26 NOTE — Progress Notes (Signed)
[facility] [place] HISTORY AND PHYSICAL   History of Present Illness:  Patient is a 72 y.o. year old male who presents for evaluation of abdominal aortic aneurysm.  He was seen on 01/18/22 in our office for AAA referred by Dr. Mayford Knife.  He is here for repeat duplex surveillance.  The threshold for intervention (5.5 cm in men).   He denies abdominal/lumbar pain out of the ordinary.  He denies claudication, rest pain and non healing wounds.  He states he can ambulate as far as he likes.  He states he does not like taking medications and stopped taking the daily ASA and he is not on a Stain.     Past Medical History:  Diagnosis Date   AAA (abdominal aortic aneurysm) (HCC)    per cardiology note , ultrasound retroperitoneal 07-22-2019,  5.5cm   Arthritis    Coronary artery disease cardiologist--- Randall An PA----  (06-08-2020  pt denies any cardiac s&s, sob, and no peripheral swelling with exception chest pain with actos medication which chest pain stopped)   hx Inferolateral STEMI 05/ 21/ 2013  s/p PCI and DES to distal RCA and OM1 ,  and chronic occlusion mid RCA with left to right collecterals , preserved LVSF with inferior wall hypokinesis   History of 2019 novel coronavirus disease (COVID-19) 04/2020   per pt positive covid at urgent care but unable to obatin result prior to surgery 06-11-2020;  pt stated had mild symptoms that resolved in a week   History of ST elevation myocardial infarction (STEMI) 07/2011   inferolateral s/p PCI and stenting   Hyperlipidemia    Hypertension    Prostate cancer [facility]) urologist--- dr dahlstedt/  oncologist---- dr Kathrynn Running   dx 03-24-2020, Gleason 3+4, Stage T1c, PSA 5.7   S/P drug eluting coronary stent placement 08/16/2011   PCI and DES x2  diatal RCA and OM1   Type 2 diabetes mellitus (HCC)    followed by pcp per pt ,  (06-08-2020 pt stated was taking actos 15mg  daily, but stopped taking about 4-6 wks ago due to causing leg  and chest pain,  stated will tell his pcp at appointment 06-15-2020,  stated he watching is diet)    Past Surgical History:  Procedure Laterality Date   COLONOSCOPY WITH PROPOFOL N/A 01/18/2018   Procedure: COLONOSCOPY WITH PROPOFOL;  Surgeon: Corbin Ade, MD;  [facility];  Service: Endoscopy;  Laterality: N/A;  10:15am   CYSTOSCOPY  06/11/2020   Procedure: CYSTOSCOPY FLEXIBLE;  Surgeon: Marcine Matar, MD;  Location[facility];  Service: Urology;;  NO SEEDS FOUND IN BLADDER   LEFT HEART CATHETERIZATION WITH CORONARY ANGIOGRAM N/A 08/16/2011   Procedure: LEFT HEART CATHETERIZATION WITH CORONARY ANGIOGRAM;  Surgeon: Peter M Swaziland, MD;  Location: [facility] CATH LAB;  Service: Cardiovascular;  Laterality: N/A;   POLYPECTOMY  01/18/2018   Procedure: POLYPECTOMY;  Surgeon: Corbin Ade, MD;  [facility];  Service: Endoscopy;;  colon   PROSTATE BIOPSY  03-24-2020  office   RADIOACTIVE SEED IMPLANT N/A 06/11/2020   Procedure: RADIOACTIVE SEED IMPLANT/BRACHYTHERAPY IMPLANT;  Surgeon: Marcine Matar, MD;  Location: [facility];  Service: Urology;  Laterality: N/A;   66    SEEDS IMPLANTED   SPACE OAR INSTILLATION N/A 06/11/2020   Procedure: SPACE OAR INSTILLATION;  Surgeon: Marcine Matar, MD;  Location: [facility];  Service: Urology;  Laterality: N/A;     Social History Social History  Tobacco Use   Smoking status: Some Days    Years: 15    Types: Cigarettes, Cigars    Start date: 12/21/1964    Last attempt to quit: 08/15/2017    Years since quitting: 4.9    Passive exposure: Current   Smokeless tobacco: Former    Types: Chew    Quit date: 11/12/2019  Vaping Use   Vaping Use: Never used  Substance Use Topics   Alcohol use: Yes    Alcohol/week: 0.0 standard drinks of alcohol    Comment: occasional   Drug use: No    Family History Family History  Problem Relation Age of Onset   Diabetes Brother     Prostate cancer Brother    Diabetes Sister    Hypertension Brother    Hypertension Sister    Hyperlipidemia Sister    Hyperlipidemia Brother    Colon cancer Neg Hx    Pancreatic cancer Neg Hx    Breast cancer Neg Hx     Allergies  No Known Allergies   Current Outpatient Medications  Medication Sig Dispense Refill   metoprolol tartrate (LOPRESSOR) 50 MG tablet Take 1 tablet (50 mg total) by mouth daily. 90 tablet 3   tamsulosin (FLOMAX) 0.4 MG CAPS capsule Take 1 capsule (0.4 mg total) by mouth daily after supper. 90 capsule 3   valsartan (DIOVAN) 160 MG tablet Take 1 tablet (160 mg total) by mouth daily. 90 tablet 3   No current facility-administered medications for this visit.    ROS:   General:  No weight loss, Fever, chills  HEENT: No recent headaches, no nasal bleeding, no visual changes, no sore throat  Neurologic: No dizziness, blackouts, seizures. No recent symptoms of stroke or mini- stroke. No recent episodes of slurred speech, or temporary blindness.  Cardiac: No recent episodes of chest pain/pressure, no shortness of breath at rest.  No shortness of breath with exertion.  Denies history of atrial fibrillation or irregular heartbeat  Vascular: No history of rest pain in feet.  No history of claudication.  No history of non-healing ulcer, No history of DVT   Pulmonary: No home oxygen, no productive cough, no hemoptysis,  No asthma or wheezing  Musculoskeletal:  [x ] Arthritis, [ ]  Low back pain,  [ ]  Joint pain  Hematologic:No history of hypercoagulable state.  No history of easy bleeding.  No history of anemia  Gastrointestinal: No hematochezia or melena,  No gastroesophageal reflux, no trouble swallowing  Urinary: [ ]  chronic Kidney disease, [ ]  on HD - [ ]  MWF or [ ]  TTHS, [ ]  Burning with urination, [ ]  Frequent urination, [ ]  Difficulty urinating;   Skin: No rashes  Psychological: No history of anxiety,  No history of depression   Physical  Examination  Vitals:   07/26/22 0802  BP: (!) 151/86  Pulse: 60  Resp: 20  Temp: 98.6 F (37 C)  TempSrc: Temporal  SpO2: 99%  Weight: 212 lb 1.6 oz (96.2 kg)  Height: 5\' 9"  (1.753 m)    Body mass index is 31.32 kg/m.  General:  Alert and oriented, no acute distress HEENT: Normal Neck: No bruit or JVD Pulmonary: Clear to auscultation bilaterally Cardiac: Regular Rate and Rhythm without murmur Gastrointestinal: Soft, non-tender, non-distended, no mass, no scars Skin: No rash Extremity Pulses:   radial,dorsalis pedis,  pulses bilaterally Musculoskeletal: No deformity or edema  Neurologic: Upper and lower extremity motor 5/5 and symmetric  DATA:  Abdominal Aorta Findings:  +-----------+-------+----------+----------+---------+--------+--------+  Location  AP (cm)Trans (cm)PSV (cm/s)Waveform ThrombusComments  +-----------+-------+----------+----------+---------+--------+--------+  Proximal  2.85   3.13      59                                   +-----------+-------+----------+----------+---------+--------+--------+  Mid       1.81             83                                   +-----------+-------+----------+----------+---------+--------+--------+  Distal    4.38   4.21      73                                   +-----------+-------+----------+----------+---------+--------+--------+  RT CIA Prox1.0    1.1       175       triphasic                  +-----------+-------+----------+----------+---------+--------+--------+  RT CIA Mid 1.5    1.6       171       biphasic                   +-----------+-------+----------+----------+---------+--------+--------+  LT CIA Prox0.9    1.0       118                                  +-----------+-------+----------+----------+---------+--------+--------+   Summary:  Abdominal Aorta: There is evidence of abnormal dilatation of the distal  Abdominal aorta. There is evidence of abnormal  dilation of the Right  Common Iliac artery. The largest aortic diameter has increased compared to  prior exam. Previous diameter  measurement was 3.8 x 4.2 cm obtained on 01/04/21.       ASSESSMENT:  AAA with slight increase in size from CT report now the largest diameter is 4.4 cm.  The patient is not yet a candidate for elective repair of the aneurysm to prevent rupture.   Unless he is symptomatic he will not require intervention until the diameter is >or equal to 5.5 cm.     PLAN: We discussed symptoms of sever abdominal/lumbar pain should be treated as an emergency.  He should seek the attention of ER.   He will f/u for surveillance duplex in 6 months.       Mosetta Pigeon PA-C [facility] [place] Office: [phone]  MD in clinic [place]

## 2022-08-01 ENCOUNTER — Other Ambulatory Visit: Payer: Self-pay

## 2022-08-01 DIAGNOSIS — I7143 Infrarenal abdominal aortic aneurysm, without rupture: Secondary | ICD-10-CM

## 2022-09-06 ENCOUNTER — Ambulatory Visit: Payer: Medicare HMO | Admitting: Urology

## 2022-11-29 ENCOUNTER — Other Ambulatory Visit: Payer: Medicare HMO

## 2022-11-30 ENCOUNTER — Ambulatory Visit: Payer: Medicare HMO | Attending: Medical | Admitting: Medical

## 2022-11-30 ENCOUNTER — Other Ambulatory Visit (HOSPITAL_COMMUNITY)
Admission: RE | Admit: 2022-11-30 | Discharge: 2022-11-30 | Disposition: A | Discharge status: Home / Self Care | Payer: Medicare HMO | Source: Ambulatory Visit | Attending: Medical | Admitting: Medical

## 2022-11-30 ENCOUNTER — Encounter: Payer: Self-pay | Admitting: Medical

## 2022-11-30 VITALS — BP 136/64 | HR 90 | Ht 69.0 in | Wt 208.8 lb

## 2022-11-30 DIAGNOSIS — I2510 Atherosclerotic heart disease of native coronary artery without angina pectoris: Secondary | ICD-10-CM | POA: Insufficient documentation

## 2022-11-30 DIAGNOSIS — Z79899 Other long term (current) drug therapy: Secondary | ICD-10-CM | POA: Diagnosis present

## 2022-11-30 DIAGNOSIS — E7800 Pure hypercholesterolemia, unspecified: Secondary | ICD-10-CM | POA: Insufficient documentation

## 2022-11-30 DIAGNOSIS — I10 Essential (primary) hypertension: Secondary | ICD-10-CM | POA: Diagnosis present

## 2022-11-30 LAB — CBC
HCT: 36.7 % — ABNORMAL LOW (ref 39.0–52.0)
Hemoglobin: 11.8 g/dL — ABNORMAL LOW (ref 13.0–17.0)
MCH: 30.1 pg (ref 26.0–34.0)
MCHC: 32.2 g/dL (ref 30.0–36.0)
MCV: 93.6 fL (ref 80.0–100.0)
Platelets: 207 10*3/uL (ref 150–400)
RBC: 3.92 MIL/uL — ABNORMAL LOW (ref 4.22–5.81)
RDW: 12.1 % (ref 11.5–15.5)
WBC: 6.9 10*3/uL (ref 4.0–10.5)
nRBC: 0 % (ref 0.0–0.2)

## 2022-11-30 LAB — LIPID PANEL
Cholesterol: 256 mg/dL — ABNORMAL HIGH (ref 0–200)
HDL: 30 mg/dL — ABNORMAL LOW (ref >40)
LDL Cholesterol: 192 mg/dL — ABNORMAL HIGH (ref 0–99)
Total CHOL/HDL Ratio: 8.5 ratio
Triglycerides: 168 mg/dL — ABNORMAL HIGH (ref <150)
VLDL: 34 mg/dL (ref 0–40)

## 2022-11-30 LAB — BASIC METABOLIC PANEL
Anion gap: 9 (ref 5–15)
BUN: 20 mg/dL (ref 8–23)
CO2: 19 mmol/L — ABNORMAL LOW (ref 22–32)
Calcium: 8.6 mg/dL — ABNORMAL LOW (ref 8.9–10.3)
Chloride: 107 mmol/L (ref 98–111)
Creatinine, Ser: 1.81 mg/dL — ABNORMAL HIGH (ref 0.61–1.24)
GFR, Estimated: 39 mL/min — ABNORMAL LOW (ref >60)
Glucose, Bld: 110 mg/dL — ABNORMAL HIGH (ref 70–99)
Potassium: 3.8 mmol/L (ref 3.5–5.1)
Sodium: 135 mmol/L (ref 135–145)

## 2022-11-30 LAB — TSH: TSH: 1.206 u[IU]/mL (ref 0.350–4.500)

## 2022-11-30 LAB — LDL CHOLESTEROL, DIRECT: Direct LDL: 218 mg/dL — ABNORMAL HIGH (ref 0–99)

## 2022-11-30 MED ORDER — ASPIRIN 81 MG PO TBEC: 81.0000 mg | DELAYED_RELEASE_TABLET | Freq: Every day | ORAL | Status: DC

## 2022-11-30 MED ORDER — NITROGLYCERIN 0.4 MG SL SUBL: 0.4000 mg | SUBLINGUAL_TABLET | SUBLINGUAL | 3 refills | Status: AC | PRN

## 2022-11-30 NOTE — Progress Notes (Signed)
Cardiology Office Note:    Date:  11/30/2022   ID:  HAYES STACHOWICZ, DOB 30-Sep-1950, MRN [id number]  PCP:  Alisia Ferrari, NP  [facility] Cardiologist:  None  [facility] Electrophysiologist:  None   Referring MD: Alisia Ferrari, NP   Chief Complaint: overdue follow-up  History of Present Illness:    Manuel Nguyen is a 72 y.o. male with a hx of CAD s/p STEMI 07/2011 with DES to OM1 and noted to have CTO of the RCA, HTN, HLD (statin intolerance, AAA dilation who presents for follow-up.   Patient had a STEMI in 2013 with DES to OM1 and noted to have CTO of RCA.   The patient was last seen 2022 and was overall stable from a cardiac perspective. A dedicated US of AAA was ordered. This showed diameter 3.8x4.2 cm.  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  Stress test was reviewed by the MD, and felt to be stable and low risk.  Today,  the patient reports he has been doing well. He reports abdominal discomfort from gas. He has rare chest pain after eating certain food. No exertional chest pain. No SOB, Lle, orthopnea or pnd. He takes ASA sometimes. He needs refills.  The patient stopped Repatha due to financial issues. Says he doesn't tolerate Zetia.  Past Medical History:  Diagnosis Date   AAA (abdominal aortic aneurysm) (HCC)    per cardiology note , ultrasound retroperitoneal 07-22-2019,  5.5cm   Arthritis    Coronary artery disease cardiologist--- Randall An PA----  (06-08-2020  pt denies any cardiac s&s, sob, and no peripheral swelling with exception chest pain with actos medication which chest pain stopped)   hx Inferolateral STEMI 05/ 21/ 2013  s/p PCI and DES to distal RCA and OM1 ,  and chronic occlusion mid RCA with left to right collecterals ,  preserved LVSF with inferior wall hypokinesis   History of 2019 novel coronavirus disease (COVID-19) 04/2020   per pt positive covid at urgent care but unable to obatin result prior to surgery 06-11-2020;  pt stated had mild symptoms that resolved in a week   History of ST elevation myocardial infarction (STEMI) 07/2011   inferolateral s/p PCI and stenting   Hyperlipidemia    Hypertension    Prostate cancer [facility]) urologist--- dr dahlstedt/  oncologist---- dr Kathrynn Running   dx 03-24-2020, Gleason 3+4, Stage T1c, PSA 5.7   S/P drug eluting coronary stent placement 08/16/2011   PCI and DES x2  diatal RCA and OM1   Type 2 diabetes mellitus (HCC)    followed by pcp per pt ,  (06-08-2020 pt stated was taking actos 15mg  daily, but stopped taking about 4-6 wks ago due to causing leg and chest pain,  stated will tell his pcp at appointment 06-15-2020,  stated he watching is diet)    Past Surgical History:  Procedure Laterality Date   COLONOSCOPY WITH PROPOFOL N/A 01/18/2018   Procedure: COLONOSCOPY WITH PROPOFOL;  Surgeon: Corbin Ade, MD;  [facility];  Service: Endoscopy;  Laterality: N/A;  10:15am   CYSTOSCOPY  06/11/2020   Procedure: CYSTOSCOPY FLEXIBLE;  Surgeon: Marcine Matar, MD;  Location: [facility];  Service: Urology;;  NO SEEDS FOUND IN BLADDER   LEFT HEART CATHETERIZATION WITH CORONARY ANGIOGRAM N/A  08/16/2011   Procedure: LEFT HEART CATHETERIZATION WITH CORONARY ANGIOGRAM;  Surgeon: Peter M Swaziland, MD;  Location: [facility] CATH LAB;  Service: Cardiovascular;  Laterality: N/A;   POLYPECTOMY  01/18/2018   Procedure: POLYPECTOMY;  Surgeon: Corbin Ade, MD;  [facility];  Service: Endoscopy;;  colon   PROSTATE BIOPSY  03-24-2020  office   RADIOACTIVE SEED IMPLANT N/A 06/11/2020   Procedure: RADIOACTIVE SEED IMPLANT/BRACHYTHERAPY IMPLANT;  Surgeon: Marcine Matar, MD;  Location: [facility];  Service: Urology;  Laterality: N/A;   66     SEEDS IMPLANTED   SPACE OAR INSTILLATION N/A 06/11/2020   Procedure: SPACE OAR INSTILLATION;  Surgeon: Marcine Matar, MD;  Location: [facility];  Service: Urology;  Laterality: N/A;    Current Medications: Current Meds  Medication Sig   aspirin EC 81 MG tablet Take 1 tablet (81 mg total) by mouth daily. Swallow whole.   metoprolol tartrate (LOPRESSOR) 50 MG tablet Take 1 tablet (50 mg total) by mouth daily.   nitroGLYCERIN (NITROSTAT) 0.4 MG SL tablet Place 1 tablet (0.4 mg total) under the tongue every 5 (five) minutes as needed for chest pain.   tamsulosin (FLOMAX) 0.4 MG CAPS capsule Take 1 capsule (0.4 mg total) by mouth daily after supper.   valsartan (DIOVAN) 160 MG tablet Take 1 tablet (160 mg total) by mouth daily.     Allergies:   Patient has no known allergies.   Social History   Socioeconomic History   Marital status: Divorced    Spouse name: Not on file   Number of children: Not on file   Years of education: Not on file   Highest education level: Not on file  Occupational History   Not on file  Tobacco Use   Smoking status: Former    Current packs/day: 0.00    Types: Cigarettes, Cigars    Start date: 12/21/1964    Quit date: 08/15/2017    Years since quitting: 5.2    Passive exposure: Current   Smokeless tobacco: Former    Types: Chew    Quit date: 11/12/2019  Vaping Use   Vaping status: Never Used  Substance and Sexual Activity   Alcohol use: Yes    Alcohol/week: 0.0 standard drinks of alcohol    Comment: occasional   Drug use: No   Sexual activity: Yes    Partners: Female  Other Topics Concern   Not on file  Social History Narrative   Not on file   Social Determinants of Health   Financial Resource Strain: Not on file  Food Insecurity: Not on file  Transportation Needs: Not on file  Physical Activity: Not on file  Stress: Not on file  Social Connections: Not on file     Family History: The patient's family history includes  Diabetes in his brother and sister; Hyperlipidemia in his brother and sister; Hypertension in his brother and sister; Prostate cancer in his brother. There is no history of Colon cancer, Pancreatic cancer, or Breast cancer.  ROS:   Please see the history of present illness.     All other systems reviewed and are negative.  EKGs/Labs/Other Studies Reviewed:    The following studies were reviewed today:  Myoview lexiscan 2023 Narrative & Impression      Findings are consistent with prior myocardial infarction with peri-infarct ischemia. The study is low risk.   No ST deviation was noted. The ECG was negative for ischemia.   LV perfusion is abnormal.  Small, mild to moderate intensity, apical to basal inferolateral defect that is partially reversible toward the apex and fixed at the base.  This is consistent with scar and mild peri-infarct ischemia.   Left ventricular function is normal. Nuclear stress EF: 63 %.   Low risk study with evidence of inferolateral scar and mild peri-infarct ischemia, LVEF 63%.    EKG:  EKG is ordered today.  The ekg ordered today demonstrates NSR 89bpm, q waves inf. Leads, no changes  Recent Labs: No results found for requested labs within last 365 days.  Recent Lipid Panel    Component Value Date/Time   CHOL 300 (H) 08/17/2011 0954   TRIG 171 (H) 08/17/2011 0954   HDL 40 08/17/2011 0954   CHOLHDL 7.5 08/17/2011 0954   VLDL 34 08/17/2011 0954   LDLCALC 226 (H) 08/17/2011 0954     Physical Exam:    VS:  BP 136/64   Pulse 90   Ht 5\' 9"  (1.753 m)   Wt 208 lb 12.8 oz (94.7 kg)   SpO2 96%   BMI 30.83 kg/m     Wt Readings from Last 3 Encounters:  11/30/22 208 lb 12.8 oz (94.7 kg)  07/26/22 212 lb 1.6 oz (96.2 kg)  11/09/21 206 lb (93.4 kg)     GEN:  Well nourished, well developed in no acute distress HEENT: Normal NECK: No JVD; No carotid bruits LYMPHATICS: No lymphadenopathy CARDIAC: RRR, no murmurs, rubs, gallops RESPIRATORY:  Clear to  auscultation without rales, wheezing or rhonchi  ABDOMEN: Soft, non-tender, non-distended MUSCULOSKELETAL:  No edema; No deformity  SKIN: Warm and dry NEUROLOGIC:  Alert and oriented x 3 PSYCHIATRIC:  Normal affect   ASSESSMENT:    1. Coronary artery disease involving native coronary artery of native heart without angina pectoris   2. Primary hypertension   3. Pure hypercholesterolemia   4. Medication management    PLAN:    In order of problems listed above:  CAD with remote stenting Patient reports rare chest pain episodes.  Breathing is stable.  Prior stress test in 2023 was abnormal, however MD reviewed and felt it was stable and low risk.  No further ischemic workup at this time.  I recommend aspirin 81 mg daily.  I will also send in sublingual nitroglycerin tab as needed chest pain.  Continue beta-blocker therapy.  I will update blood work today, BMET, CBC, TSH, lipid profile.  Hypertension Blood pressure today is reasonable.  Continue Lopressor 50 mg daily and valsartan 160 mg daily.  Hyperlipidemia I will update lipid profile today.  He is intolerant to statins.  Patient was previously on Repatha, however he stopped it due to financial issues.  We will restart this along with patient assistance program.  We will bring him back in 3 months to assess cholesterol and make any necessary changes.  Disposition: Follow up in 3 month(s) with Md/APP    Signed, Takeela Peil David Stall, PA-C  11/30/2022 3:33 PM    [facility]

## 2022-11-30 NOTE — Patient Instructions (Addendum)
Medication Instructions:   Start Aspirin 81 mg Daily   Start Nitroglycerin 0.4 mg as needed for chest pain   *If you need a refill on your cardiac medications before your next appointment, please call your pharmacy*   Lab Work: Your physician recommends that you return for lab work in: Today   If you have labs (blood work) drawn today and your tests are completely normal, you will receive your results only by: MyChart Message (if you have MyChart) OR A paper copy in the mail If you have any lab test that is abnormal or we need to change your treatment, we will call you to review the results.   Testing/Procedures: NONE    Follow-Up: At [facility], you and your health needs are our priority.  As part of our continuing mission to provide you with exceptional heart care, we have created designated Provider Care Teams.  These Care Teams include your primary Cardiologist (physician) and Advanced Practice Providers (APPs -  Physician Assistants and Nurse Practitioners) who all work together to provide you with the care you need, when you need it.  We recommend signing up for the patient portal called "MyChart".  Sign up information is provided on this After Visit Summary.  MyChart is used to connect with patients for Virtual Visits (Telemedicine).  Patients are able to view lab/test results, encounter notes, upcoming appointments, etc.  Non-urgent messages can be sent to your provider as well.   To learn more about what you can do with MyChart, go to [email or web address].    Your next appointment:   3 month(s)  Provider:    Dr. Apolonio Schneiders   Other Instructions Thank you for choosing [facility]!

## 2022-12-05 NOTE — Progress Notes (Signed)
History of Present Illness: Here for follow-up of prostate cancer, treated   12.7.2021: TRUS/Bx. PSA 5.7, prostate volume 49.76 mL. PSAD 0.11. 8/12 cores positive for adenocarcinoma. 6 cores w/ GS 3+3 pattern-- Lt mid lateral 5% of core, Lt apex lateral 30% of core,  Rt apex medial, 5% of core,  Rt apex lateral, 20% of core, Rt mid lateral, 5% of core, Rt base lateral, 10% of core 2 cores revealed GS 3+4 pattern --Rt mid medial, 10% of core,  Lt base lateral, 10% of core   3.17.2022: Underwent I 125 brachytherapy and SpaceOAR placement.  3.12.2024: PSA 1.3.  Past Medical History:  Diagnosis Date   AAA (abdominal aortic aneurysm) (HCC)    per cardiology note , ultrasound retroperitoneal 07-22-2019,  5.5cm   Arthritis    Coronary artery disease cardiologist--- Randall An PA----  (06-08-2020  pt denies any cardiac s&s, sob, and no peripheral swelling with exception chest pain with actos medication which chest pain stopped)   hx Inferolateral STEMI 05/ 21/ 2013  s/p PCI and DES to distal RCA and OM1 ,  and chronic occlusion mid RCA with left to right collecterals , preserved LVSF with inferior wall hypokinesis   History of 2019 novel coronavirus disease (COVID-19) 04/2020   per pt positive covid at urgent care but unable to obatin result prior to surgery 06-11-2020;  pt stated had mild symptoms that resolved in a week   History of ST elevation myocardial infarction (STEMI) 07/2011   inferolateral s/p PCI and stenting   Hyperlipidemia    Hypertension    Prostate cancer [facility]) urologist--- dr Jerusha Reising/  oncologist---- dr Kathrynn Running   dx 03-24-2020, Gleason 3+4, Stage T1c, PSA 5.7   S/P drug eluting coronary stent placement 08/16/2011   PCI and DES x2  diatal RCA and OM1   Type 2 diabetes mellitus (HCC)    followed by pcp per pt ,  (06-08-2020 pt stated was taking actos 15mg  daily, but stopped taking about 4-6 wks ago due to causing leg and chest pain,  stated will tell his pcp at  appointment 06-15-2020,  stated he watching is diet)    Past Surgical History:  Procedure Laterality Date   COLONOSCOPY WITH PROPOFOL N/A 01/18/2018   Procedure: COLONOSCOPY WITH PROPOFOL;  Surgeon: Corbin Ade, MD;  [facility];  Service: Endoscopy;  Laterality: N/A;  10:15am   CYSTOSCOPY  06/11/2020   Procedure: CYSTOSCOPY FLEXIBLE;  Surgeon: Marcine Matar, MD;  [facility];  Service: Urology;;  NO SEEDS FOUND IN BLADDER   LEFT HEART CATHETERIZATION WITH CORONARY ANGIOGRAM N/A 08/16/2011   Procedure: LEFT HEART CATHETERIZATION WITH CORONARY ANGIOGRAM;  Surgeon: Peter M Swaziland, MD;  Location: [facility] CATH LAB;  Service: Cardiovascular;  Laterality: N/A;   POLYPECTOMY  01/18/2018   Procedure: POLYPECTOMY;  Surgeon: Corbin Ade, MD;  [facility];  Service: Endoscopy;;  colon   PROSTATE BIOPSY  03-24-2020  office   RADIOACTIVE SEED IMPLANT N/A 06/11/2020   Procedure: RADIOACTIVE SEED IMPLANT/BRACHYTHERAPY IMPLANT;  Surgeon: Marcine Matar, MD;  Location[facility];  Service: Urology;  Laterality: N/A;   67    SEEDS IMPLANTED   SPACE OAR INSTILLATION N/A 06/11/2020   Procedure: SPACE OAR INSTILLATION;  Surgeon: Marcine Matar, MD;  Location: [facility];  Service: Urology;  Laterality: N/A;    Home Medications:  Allergies as of 12/06/2022   No Known Allergies      Medication List  Accurate as of December 05, 2022  3:37 PM. If you have any questions, ask your nurse or doctor.          aspirin EC 81 MG tablet Take 1 tablet (81 mg total) by mouth daily. Swallow whole.   metoprolol tartrate 50 MG tablet Commonly known as: LOPRESSOR Take 1 tablet (50 mg total) by mouth daily.   nitroGLYCERIN 0.4 MG SL tablet Commonly known as: NITROSTAT Place 1 tablet (0.4 mg total) under the tongue every 5 (five) minutes as needed for chest pain.   tamsulosin 0.4 MG Caps capsule Commonly known  as: FLOMAX Take 1 capsule (0.4 mg total) by mouth daily after supper.   valsartan 160 MG tablet Commonly known as: DIOVAN Take 1 tablet (160 mg total) by mouth daily.        Allergies: No Known Allergies  Family History  Problem Relation Age of Onset   Diabetes Brother    Prostate cancer Brother    Diabetes Sister    Hypertension Brother    Hypertension Sister    Hyperlipidemia Sister    Hyperlipidemia Brother    Colon cancer Neg Hx    Pancreatic cancer Neg Hx    Breast cancer Neg Hx     Social History:  reports that he quit smoking about 5 years ago. His smoking use included cigarettes and cigars. He started smoking about 57 years ago. He has been exposed to tobacco smoke. He quit smokeless tobacco use about 3 years ago.  His smokeless tobacco use included chew. He reports current alcohol use. He reports that he does not use drugs.  ROS: A complete review of systems was performed.  All systems are negative except for pertinent findings as noted.  Physical Exam:  Vital signs in last 24 hours: There were no vitals taken for this visit. Constitutional:  Alert and oriented, No acute distress Cardiovascular: Regular rate  Respiratory: Normal respiratory effort GI: Abdomen is soft, nontender, nondistended, no abdominal masses. No CVAT.  Genitourinary: Normal male phallus, testes are descended bilaterally and non-tender and without masses, scrotum is normal in appearance without lesions or masses, perineum is normal on inspection. Lymphatic: No lymphadenopathy Neurologic: Grossly intact, no focal deficits Psychiatric: Normal mood and affect  I have reviewed prior pt notes  I have reviewed urinalysis results  I have independently reviewed prior imaging--prostate U/S  I have reviewed prior PSA and pathology results    Impression/Assessment:  ***  Plan:  ***

## 2022-12-06 ENCOUNTER — Ambulatory Visit: Payer: Medicare HMO | Admitting: Urology

## 2022-12-06 ENCOUNTER — Encounter: Payer: Self-pay | Admitting: Urology

## 2022-12-06 VITALS — BP 144/90 | HR 69

## 2022-12-06 DIAGNOSIS — N138 Other obstructive and reflux uropathy: Secondary | ICD-10-CM

## 2022-12-06 DIAGNOSIS — N401 Enlarged prostate with lower urinary tract symptoms: Secondary | ICD-10-CM

## 2022-12-06 DIAGNOSIS — Z8546 Personal history of malignant neoplasm of prostate: Secondary | ICD-10-CM | POA: Diagnosis not present

## 2022-12-06 DIAGNOSIS — N5201 Erectile dysfunction due to arterial insufficiency: Secondary | ICD-10-CM | POA: Diagnosis not present

## 2022-12-06 LAB — URINALYSIS, ROUTINE W REFLEX MICROSCOPIC
Bilirubin, UA: NEGATIVE
Glucose, UA: NEGATIVE
Ketones, UA: NEGATIVE
Nitrite, UA: NEGATIVE
Protein,UA: NEGATIVE
RBC, UA: NEGATIVE
Specific Gravity, UA: 1.025 (ref 1.005–1.030)
Urobilinogen, Ur: 0.2 mg/dL (ref 0.2–1.0)
pH, UA: 6 (ref 5.0–7.5)

## 2022-12-06 LAB — MICROSCOPIC EXAMINATION
Bacteria, UA: NONE SEEN
RBC, Urine: NONE SEEN /HPF (ref 0–2)

## 2022-12-06 LAB — BLADDER SCAN AMB NON-IMAGING: Scan Result: 0

## 2022-12-06 MED ORDER — SILDENAFIL CITRATE 100 MG PO TABS
ORAL_TABLET | ORAL | 99 refills | Status: DC
Start: 2022-12-06 — End: 2023-08-11

## 2022-12-09 LAB — PSA: Prostate Specific Ag, Serum: 0.8 ng/mL (ref 0.0–4.0)

## 2022-12-13 ENCOUNTER — Telehealth: Payer: Self-pay

## 2022-12-13 NOTE — Telephone Encounter (Signed)
Patient is made aware and voiced understanding.

## 2022-12-13 NOTE — Telephone Encounter (Signed)
-----   Message from Bertram Millard Dahlstedt sent at 12/12/2022 12:48 PM EDT ----- Let pt know that psa continues to decrease--now 0.8 ----- Message ----- From: Interface, [facility] Lab Results In Sent: 12/06/2022   3:36 PM EDT To: Marcine Matar, MD

## 2022-12-21 ENCOUNTER — Other Ambulatory Visit (HOSPITAL_COMMUNITY): Payer: Self-pay

## 2022-12-21 ENCOUNTER — Telehealth: Payer: Self-pay | Admitting: Pharmacy Technician

## 2022-12-21 ENCOUNTER — Encounter: Payer: Self-pay | Admitting: Cardiology

## 2022-12-21 MED ORDER — REPATHA SURECLICK 140 MG/ML ~~LOC~~ SOAJ: 1.0000 mL | SUBCUTANEOUS | 11 refills | Status: DC

## 2022-12-21 NOTE — Telephone Encounter (Signed)
Pharmacy Patient Advocate Encounter   Received notification from  staff messages  that prior authorization for repatha is required/requested.   Insurance verification completed.   The patient is insured through [employer] .   Per test claim: The current 12/21/22 day co-pay is, $45.00.  No PA needed at this time. This test claim was processed through [facility]- copay amounts may vary at other pharmacies due to pharmacy/plan contracts, or as the patient moves through the different stages of their insurance plan.

## 2022-12-21 NOTE — Addendum Note (Signed)
Addended by: Malena Peer D on: 12/21/2022 10:01 AM   Modules accepted: Orders

## 2022-12-21 NOTE — Telephone Encounter (Signed)
-----   Message from Olene Floss sent at 12/20/2022  4:44 PM EDT ----- Can you do a PA for Repatha? thanks

## 2022-12-21 NOTE — Telephone Encounter (Signed)
I spoke with patient. Per scheduler he was asking if we could just call in another rx for Repatha. He was off due to cost. I called pt and reviewed cost would be $45. He was ok with this and requested Rx be sent to [facility] pharmacy. Rx sent.

## 2023-01-24 ENCOUNTER — Ambulatory Visit: Payer: Medicare HMO | Admitting: Vascular Surgery

## 2023-01-24 ENCOUNTER — Other Ambulatory Visit (HOSPITAL_COMMUNITY): Payer: Medicare HMO

## 2023-02-06 NOTE — Progress Notes (Unsigned)
[facility] [place]  ASSESSMENT / PLAN: Manuel Nguyen is a 72 y.o. male with a infrarenal abdominal aortic aneurysm measuring 46 mm.  Recommend:  Abstinence from all tobacco products. Blood glucose control with goal A1c < 7%. Blood pressure control with goal blood pressure < 140/90 mmHg. Lipid reduction therapy with goal LDL-C <100 mg/dL.  Aspirin 81mg  PO QD.  Atorvastatin 40-80mg  PO QD (or other "high intensity" statin therapy).  Follow up in 6 months with AAA duplex  CHIEF COMPLAINT: AAA surveillance  HISTORY OF PRESENT ILLNESS: 01/18/21: Manuel Nguyen is a 72 y.o. male  referred to clinic for discovery of abdominal aortic aneurysm on ultrasound by Dr. Mayford Knife.  He is referred for discussion of aneurysm.  The patient is asymptomatic.  He has multiple excellent questions about aneurysm disease.  We had a lengthy discussion about the natural history of aneurysm disease, the rationale for surveillance program, and the threshold for intervention (5.5 cm in men).   02/07/23: Returns to clinic for surveillance. No issues since we last saw him in 2022. Reviewed his most recent duplex in detail.  Past Medical History:  Diagnosis Date   AAA (abdominal aortic aneurysm) (HCC)    per cardiology note , ultrasound retroperitoneal 07-22-2019,  5.5cm   Arthritis    Coronary artery disease cardiologist--- Randall An PA----  (06-08-2020  pt denies any cardiac s&s, sob, and no peripheral swelling with exception chest pain with actos medication which chest pain stopped)   hx Inferolateral STEMI 05/ 21/ 2013  s/p PCI and DES to distal RCA and OM1 ,  and chronic occlusion mid RCA with left to right collecterals , preserved LVSF with inferior wall hypokinesis   History of 2019 novel coronavirus disease (COVID-19) 04/2020   per pt positive covid at urgent care but unable to obatin result prior to surgery 06-11-2020;  pt stated had mild symptoms that resolved in a week    History of ST elevation myocardial infarction (STEMI) 07/2011   inferolateral s/p PCI and stenting   Hyperlipidemia    Hypertension    Prostate cancer [facility]) urologist--- dr dahlstedt/  oncologist---- dr Kathrynn Running   dx 03-24-2020, Gleason 3+4, Stage T1c, PSA 5.7   S/P drug eluting coronary stent placement 08/16/2011   PCI and DES x2  diatal RCA and OM1   Type 2 diabetes mellitus (HCC)    followed by pcp per pt ,  (06-08-2020 pt stated was taking actos 15mg  daily, but stopped taking about 4-6 wks ago due to causing leg and chest pain,  stated will tell his pcp at appointment 06-15-2020,  stated he watching is diet)    Past Surgical History:  Procedure Laterality Date   COLONOSCOPY WITH PROPOFOL N/A 01/18/2018   Procedure: COLONOSCOPY WITH PROPOFOL;  Surgeon: Corbin Ade, MD;  [facility];  Service: Endoscopy;  Laterality: N/A;  10:15am   CYSTOSCOPY  06/11/2020   Procedure: CYSTOSCOPY FLEXIBLE;  Surgeon: Marcine Matar, MD;  Location: [facility];  Service: Urology;;  NO SEEDS FOUND IN BLADDER   LEFT HEART CATHETERIZATION WITH CORONARY ANGIOGRAM N/A 08/16/2011   Procedure: LEFT HEART CATHETERIZATION WITH CORONARY ANGIOGRAM;  Surgeon: Peter M Swaziland, MD;  Location: [facility] CATH LAB;  Service: Cardiovascular;  Laterality: N/A;   POLYPECTOMY  01/18/2018   Procedure: POLYPECTOMY;  Surgeon: Corbin Ade, MD;  [facility];  Service: Endoscopy;;  colon   PROSTATE BIOPSY  03-24-2020  office   RADIOACTIVE SEED IMPLANT  N/A 06/11/2020   Procedure: RADIOACTIVE SEED IMPLANT/BRACHYTHERAPY IMPLANT;  Surgeon: Marcine Matar, MD;  Location: [facility];  Service: Urology;  Laterality: N/A;   67    SEEDS IMPLANTED   SPACE OAR INSTILLATION N/A 06/11/2020   Procedure: SPACE OAR INSTILLATION;  Surgeon: Marcine Matar, MD;  Location[facility];  Service: Urology;  Laterality: N/A;    Family History  Problem Relation Age of Onset    Diabetes Brother    Prostate cancer Brother    Diabetes Sister    Hypertension Brother    Hypertension Sister    Hyperlipidemia Sister    Hyperlipidemia Brother    Colon cancer Neg Hx    Pancreatic cancer Neg Hx    Breast cancer Neg Hx     Social History   Socioeconomic History   Marital status: Divorced    Spouse name: Not on file   Number of children: Not on file   Years of education: Not on file   Highest education level: Not on file  Occupational History   Not on file  Tobacco Use   Smoking status: Former    Current packs/day: 0.00    Types: Cigarettes, Cigars    Start date: 12/21/1964    Quit date: 08/15/2017    Years since quitting: 5.4    Passive exposure: Current   Smokeless tobacco: Former    Types: Chew    Quit date: 11/12/2019  Vaping Use   Vaping status: Never Used  Substance and Sexual Activity   Alcohol use: Yes    Alcohol/week: 0.0 standard drinks of alcohol    Comment: occasional   Drug use: No   Sexual activity: Yes    Partners: Female  Other Topics Concern   Not on file  Social History Narrative   Not on file   Social Determinants of Health   Financial Resource Strain: Not on file  Food Insecurity: Not on file  Transportation Needs: Not on file  Physical Activity: Not on file  Stress: Not on file  Social Connections: Not on file  Intimate Partner Violence: Not on file    No Known Allergies  Current Outpatient Medications  Medication Sig Dispense Refill   aspirin EC 81 MG tablet Take 1 tablet (81 mg total) by mouth daily. Swallow whole.     Evolocumab (REPATHA SURECLICK) 140 MG/ML SOAJ Inject 140 mg into the skin every 14 (fourteen) days. 2 mL 11   metoprolol tartrate (LOPRESSOR) 50 MG tablet Take 1 tablet (50 mg total) by mouth daily. 90 tablet 3   nitroGLYCERIN (NITROSTAT) 0.4 MG SL tablet Place 1 tablet (0.4 mg total) under the tongue every 5 (five) minutes as needed for chest pain. 75 tablet 3   sildenafil (VIAGRA) 100 MG tablet Take  1/2 to 1 tablet p.o. as needed 20 tablet prn   tamsulosin (FLOMAX) 0.4 MG CAPS capsule Take 1 capsule (0.4 mg total) by mouth daily after supper. 90 capsule 3   valsartan (DIOVAN) 160 MG tablet Take 1 tablet (160 mg total) by mouth daily. 90 tablet 3   No current facility-administered medications for this visit.    PHYSICAL EXAM Vitals:   02/07/23 0820  BP: (!) 142/82  Pulse: 64  Resp: 20  Temp: 98.2 F (36.8 C)  SpO2: 98%  Weight: 208 lb (94.3 kg)  Height: 5\' 9"  (1.753 m)    Well appearing elderly man in no distress Regular rate and rhythm Unlabored breathing Abdomen benign   PERTINENT  LABORATORY AND RADIOLOGIC DATA  Most recent CBC    Latest Ref Rng & Units 11/30/2022    3:58 PM 06/08/2020    9:27 AM 12/18/2018    1:15 PM  CBC  WBC 4.0 - 10.5 K/uL 6.9  6.0  6.3   Hemoglobin 13.0 - 17.0 g/dL 27.2  53.6  64.4   Hematocrit 39.0 - 52.0 % 36.7  44.6  41.8   Platelets 150 - 400 K/uL 207  215  249      Most recent CMP    Latest Ref Rng & Units 11/30/2022    3:58 PM 12/30/2020    9:15 AM 06/08/2020    9:27 AM  CMP  Glucose 70 - 99 mg/dL 034  742  595   BUN 8 - 23 mg/dL 20  20  18    Creatinine 0.61 - 1.24 mg/dL 6.38  7.56  4.33   Sodium 135 - 145 mmol/L 135  137  138   Potassium 3.5 - 5.1 mmol/L 3.8  4.3  3.7   Chloride 98 - 111 mmol/L 107  106  107   CO2 22 - 32 mmol/L 19  24  18    Calcium 8.9 - 10.3 mg/dL 8.6  9.2  9.0   Total Protein 6.5 - 8.1 g/dL   7.6   Total Bilirubin 0.3 - 1.2 mg/dL   0.8   Alkaline Phos 38 - 126 U/L   58   AST 15 - 41 U/L   22   ALT 0 - 44 U/L   19     Renal function CrCl cannot be calculated (Patient's most recent lab result is older than the maximum 21 days allowed.).  Hgb A1c MFr Bld (%)  Date Value  08/16/2011 7.2 (H)    LDL Cholesterol  Date Value Ref Range Status  11/30/2022 192 (H) 0 - 99 mg/dL Final    Comment:           Total Cholesterol/HDL:CHD Risk Coronary Heart Disease Risk Table                     Men   Women   1/2 Average Risk   3.4   3.3  Average Risk       5.0   4.4  2 X Average Risk   9.6   7.1  3 X Average Risk  23.4   11.0        Use the calculated Patient Ratio above and the CHD Risk Table to determine the patient's CHD Risk.        ATP III CLASSIFICATION (LDL):  <100     mg/dL   Optimal  295-188  mg/dL   Near or Above                    Optimal  130-159  mg/dL   Borderline  416-606  mg/dL   High  >301     mg/dL   Very High Performed at [facility], [address], [place], [place] [address]    Direct LDL  Date Value Ref Range Status  11/30/2022 218 (H) 0 - 99 mg/dL Final    Comment:    Performed at [facility], [address], [place], [place] [address]     Vascular Imaging: 46mm infrarenal AAA without rupture on duplex   Rande Brunt. Lenell Antu, MD [facility] Phone Number: [phone] 02/07/2023 8:33 AM  Total time spent on preparing this encounter including chart review, data review, collecting history, examining the patient, coordinating care for this established patient, 30 minutes.  Portions of this report may have been transcribed using voice recognition software.  Every effort has been made to ensure accuracy; however, inadvertent computerized transcription errors may still be present.

## 2023-02-07 ENCOUNTER — Ambulatory Visit (HOSPITAL_COMMUNITY)
Admission: RE | Admit: 2023-02-07 | Discharge: 2023-02-07 | Disposition: A | Discharge status: Home / Self Care | Payer: Medicare HMO | Source: Ambulatory Visit | Attending: Vascular Surgery | Admitting: Vascular Surgery

## 2023-02-07 ENCOUNTER — Encounter: Payer: Self-pay | Admitting: Vascular Surgery

## 2023-02-07 ENCOUNTER — Ambulatory Visit: Payer: Medicare HMO | Admitting: Vascular Surgery

## 2023-02-07 VITALS — BP 142/82 | HR 64 | Temp 98.2°F | Resp 20 | Ht 69.0 in | Wt 208.0 lb

## 2023-02-07 DIAGNOSIS — I7143 Infrarenal abdominal aortic aneurysm, without rupture: Secondary | ICD-10-CM

## 2023-02-17 ENCOUNTER — Other Ambulatory Visit: Payer: Self-pay

## 2023-02-17 DIAGNOSIS — I7143 Infrarenal abdominal aortic aneurysm, without rupture: Secondary | ICD-10-CM

## 2023-03-01 ENCOUNTER — Ambulatory Visit: Payer: Medicare HMO | Attending: Student | Admitting: Student

## 2023-03-01 NOTE — Progress Notes (Unsigned)
Cardiology Office Note    Date:  03/01/2023  ID:  Manuel Nguyen, DOB Jul 23, 1950, MRN [id number] Cardiologist: Previously Dr. Mayford Knife  History of Present Illness:    Manuel Nguyen is a 72 y.o. male  with past medical history of CAD (s/p STEMI in 07/2011 with DES to OM1 and noted to have CTO of RCA), HTN, HLD (statin intolerant) and infrarenal abdominal aortic aneurysm who presents to the office today for 56-month follow-up.  He was last examined by Manuel Furth, PA in 11/2022 and reported occasional abdominal discomfort after consuming certain foods but not specific angina  He did report stopping Repatha due to financial issues and did not tolerate Zetia. He was encouraged to restart this with patient assistance information being provided. Was continued on ASA 81 mg daily, Lopressor 50 mg daily (unclear why not twice daily) and Valsartan 160 mg daily. By review of notes, that the pharmacy department did assist with his prior authorization and  Repatha was approved for $45 which he reported would be affordable for him.  He also had an appointment with Vascular in the interim for follow-up of his infrarenal abdominal aortic aneurysm and repeat imaging was obtained in 01/2023 which showed this was at 4.4 cm with plans for follow-up imaging in 6 months.  - FLP? - adjust Lopressor to Toprol-XL?  ROS: ***  Studies Reviewed:   EKG: EKG is*** ordered today and demonstrates ***   EKG Interpretation Date/Time:    Ventricular Rate:    PR Interval:    QRS Duration:    QT Interval:    QTC Calculation:   R Axis:      Text Interpretation:         NST: 06/2021   Findings are consistent with prior myocardial infarction with peri-infarct ischemia. The study is low risk.   No ST deviation was noted. The ECG was negative for ischemia.   LV perfusion is abnormal.  Small, mild to moderate intensity, apical to basal inferolateral defect that is partially reversible toward the apex and fixed at the  base.  This is consistent with scar and mild peri-infarct ischemia.   Left ventricular function is normal. Nuclear stress EF: 63 %.   Low risk study with evidence of inferolateral scar and mild peri-infarct ischemia, LVEF 63%.  Risk Assessment/Calculations:   {Does this patient have ATRIAL FIBRILLATION?:[id number]} No BP recorded.  {Refresh Note OR Click here to enter BP  :1}***         Physical Exam:   VS:  There were no vitals taken for this visit.   Wt Readings from Last 3 Encounters:  02/07/23 208 lb (94.3 kg)  11/30/22 208 lb 12.8 oz (94.7 kg)  07/26/22 212 lb 1.6 oz (96.2 kg)     GEN: Well nourished, well developed in no acute distress NECK: No JVD; No carotid bruits CARDIAC: ***RRR, no murmurs, rubs, gallops RESPIRATORY:  Clear to auscultation without rales, wheezing or rhonchi  ABDOMEN: Appears non-distended. No obvious abdominal masses. EXTREMITIES: No clubbing or cyanosis. No edema.  Distal pedal pulses are 2+ bilaterally.   Assessment and Plan:   1. CAD - He is s/p STEMI in 07/2011 with DES to OM1 and noted to have CTO of RCA.  Most recent ischemic evaluation was a Myoview in 06/2021 which showed evidence of prior infarct with mild peri-infarct ischemia but was overall a low risk study. ***  2. HTN - ***  3. HLD - LDL was elevated at 192 in 11/2022  but he had been off Repatha in the interim. ***  4. Infrarenal abdominal aortic aneurysm - Followed by Vascular. This measured 4.4 cm in 01/2023 with plans for follow-up imaging in 6 months.    Signed, Ellsworth Lennox, PA-C

## 2023-03-02 ENCOUNTER — Encounter: Payer: Self-pay | Admitting: Student

## 2023-03-29 DEATH — deceased

## 2023-06-06 ENCOUNTER — Other Ambulatory Visit: Payer: Self-pay

## 2023-06-11 NOTE — Progress Notes (Incomplete)
 History of Present Illness: Here for follow-up of prostate cancer, treated   12.7.2021: TRUS/Bx. PSA 5.7, prostate volume 49.76 mL. PSAD 0.11. 8/12 cores positive for adenocarcinoma. 6 cores w/ GS 3+3 pattern-- Lt mid lateral 5% of core, Lt apex lateral 30% of core,  Rt apex medial, 5% of core,  Rt apex lateral, 20% of core, Rt mid lateral, 5% of core, Rt base lateral, 10% of core 2 cores revealed GS 3+4 pattern --Rt mid medial, 10% of core,  Lt base lateral, 10% of core   3.17.2022: Underwent I 125 brachytherapy and SpaceOAR placement.  3.18.2025:  Past Medical History:  Diagnosis Date   AAA (abdominal aortic aneurysm) (HCC)    per cardiology note , ultrasound retroperitoneal 07-22-2019,  5.5cm   Arthritis    Coronary artery disease cardiologist--- Randall An PA----  (06-08-2020  pt denies any cardiac s&s, sob, and no peripheral swelling with exception chest pain with actos medication which chest pain stopped)   hx Inferolateral STEMI 05/ 21/ 2013  s/p PCI and DES to distal RCA and OM1 ,  and chronic occlusion mid RCA with left to right collecterals , preserved LVSF with inferior wall hypokinesis   History of 2019 novel coronavirus disease (COVID-19) 04/2020   per pt positive covid at urgent care but unable to obatin result prior to surgery 06-11-2020;  pt stated had mild symptoms that resolved in a week   History of ST elevation myocardial infarction (STEMI) 07/2011   inferolateral s/p PCI and stenting   Hyperlipidemia    Hypertension    Prostate cancer [facility]) urologist--- dr Christerpher Clos/  oncologist---- dr Kathrynn Running   dx 03-24-2020, Gleason 3+4, Stage T1c, PSA 5.7   S/P drug eluting coronary stent placement 08/16/2011   PCI and DES x2  diatal RCA and OM1   Type 2 diabetes mellitus (HCC)    followed by pcp per pt ,  (06-08-2020 pt stated was taking actos 15mg  daily, but stopped taking about 4-6 wks ago due to causing leg and chest pain,  stated will tell his pcp at appointment  06-15-2020,  stated he watching is diet)    Past Surgical History:  Procedure Laterality Date   COLONOSCOPY WITH PROPOFOL N/A 01/18/2018   Procedure: COLONOSCOPY WITH PROPOFOL;  Surgeon: Corbin Ade, MD;  [facility];  Service: Endoscopy;  Laterality: N/A;  10:15am   CYSTOSCOPY  06/11/2020   Procedure: CYSTOSCOPY FLEXIBLE;  Surgeon: Marcine Matar, MD;  Location: [facility];  Service: Urology;;  NO SEEDS FOUND IN BLADDER   LEFT HEART CATHETERIZATION WITH CORONARY ANGIOGRAM N/A 08/16/2011   Procedure: LEFT HEART CATHETERIZATION WITH CORONARY ANGIOGRAM;  Surgeon: Peter M Swaziland, MD;  Location: [facility] CATH LAB;  Service: Cardiovascular;  Laterality: N/A;   POLYPECTOMY  01/18/2018   Procedure: POLYPECTOMY;  Surgeon: Corbin Ade, MD;  Location: [facility];  Service: Endoscopy;;  colon   PROSTATE BIOPSY  03-24-2020  office   RADIOACTIVE SEED IMPLANT N/A 06/11/2020   Procedure: RADIOACTIVE SEED IMPLANT/BRACHYTHERAPY IMPLANT;  Surgeon: Marcine Matar, MD;  Location: [facility];  Service: Urology;  Laterality: N/A;   67    SEEDS IMPLANTED   SPACE OAR INSTILLATION N/A 06/11/2020   Procedure: SPACE OAR INSTILLATION;  Surgeon: Marcine Matar, MD;  Location: [facility];  Service: Urology;  Laterality: N/A;    Home Medications:  Allergies as of 06/13/2023   No Known Allergies      Medication List  Accurate as of June 11, 2023  8:16 AM. If you have any questions, ask your nurse or doctor.          aspirin EC 81 MG tablet Take 1 tablet (81 mg total) by mouth daily. Swallow whole.   metoprolol tartrate 50 MG tablet Commonly known as: LOPRESSOR Take 1 tablet (50 mg total) by mouth daily.   nitroGLYCERIN 0.4 MG SL tablet Commonly known as: NITROSTAT Place 1 tablet (0.4 mg total) under the tongue every 5 (five) minutes as needed for chest pain.   Repatha SureClick 140 MG/ML Soaj Generic drug:  Evolocumab Inject 140 mg into the skin every 14 (fourteen) days.   sildenafil 100 MG tablet Commonly known as: VIAGRA Take 1/2 to 1 tablet p.o. as needed   tamsulosin 0.4 MG Caps capsule Commonly known as: FLOMAX Take 1 capsule (0.4 mg total) by mouth daily after supper.   valsartan 160 MG tablet Commonly known as: DIOVAN Take 1 tablet (160 mg total) by mouth daily.        Allergies: No Known Allergies  Family History  Problem Relation Age of Onset   Diabetes Brother    Prostate cancer Brother    Diabetes Sister    Hypertension Brother    Hypertension Sister    Hyperlipidemia Sister    Hyperlipidemia Brother    Colon cancer Neg Hx    Pancreatic cancer Neg Hx    Breast cancer Neg Hx     Social History:  reports that he quit smoking about 5 years ago. His smoking use included cigarettes and cigars. He started smoking about 58 years ago. He has been exposed to tobacco smoke. He quit smokeless tobacco use about 3 years ago.  His smokeless tobacco use included chew. He reports current alcohol use. He reports that he does not use drugs.  ROS: A complete review of systems was performed.  All systems are negative except for pertinent findings as noted.  Physical Exam:  Vital signs in last 24 hours: There were no vitals taken for this visit. Constitutional:  Alert and oriented, No acute distress Cardiovascular: Regular rate  Respiratory: Normal respiratory effort Genitourinary: Normal anal sphincter tone.  Prostate flat, smooth.  No nodularity.  No rectal masses. Neurologic: Grossly intact, no focal deficits Psychiatric: Normal mood and affect  I have reviewed prior pt notes  I have reviewed urinalysis results  I have independently reviewed prior imaging--prostate U/S  I have reviewed prior PSA and pathology results    Impression/Assessment:  1.  Grade group 2 prostate cancer, status post brachytherapy   2.  ED  Plan:

## 2023-06-13 ENCOUNTER — Ambulatory Visit: Payer: Self-pay | Admitting: Urology

## 2023-06-13 DIAGNOSIS — N138 Other obstructive and reflux uropathy: Secondary | ICD-10-CM

## 2023-06-13 DIAGNOSIS — N5201 Erectile dysfunction due to arterial insufficiency: Secondary | ICD-10-CM

## 2023-06-13 DIAGNOSIS — Z8546 Personal history of malignant neoplasm of prostate: Secondary | ICD-10-CM

## 2023-08-15 ENCOUNTER — Other Ambulatory Visit: Payer: Medicare HMO

## 2023-08-15 ENCOUNTER — Ambulatory Visit: Payer: Medicare HMO | Admitting: Vascular Surgery

## 2023-08-15 ENCOUNTER — Other Ambulatory Visit: Payer: Self-pay

## 2023-08-15 ENCOUNTER — Ambulatory Visit: Admitting: Vascular Surgery

## 2023-08-17 ENCOUNTER — Ambulatory Visit: Admitting: Vascular Surgery
